# Patient Record
Sex: Female | Born: 1970 | Race: Black or African American | Hispanic: No | Marital: Single | Smoking: Former smoker
Health system: Southern US, Community
[De-identification: ages and names within clinical notes are randomized; demographics above are authoritative.]

## PROBLEM LIST (undated history)

## (undated) DIAGNOSIS — M1990 Unspecified osteoarthritis, unspecified site: Secondary | ICD-10-CM

## (undated) DIAGNOSIS — E785 Hyperlipidemia, unspecified: Secondary | ICD-10-CM

## (undated) DIAGNOSIS — I10 Essential (primary) hypertension: Secondary | ICD-10-CM

## (undated) DIAGNOSIS — G4700 Insomnia, unspecified: Secondary | ICD-10-CM

## (undated) DIAGNOSIS — B019 Varicella without complication: Secondary | ICD-10-CM

## (undated) DIAGNOSIS — K219 Gastro-esophageal reflux disease without esophagitis: Secondary | ICD-10-CM

## (undated) DIAGNOSIS — N390 Urinary tract infection, site not specified: Secondary | ICD-10-CM

## (undated) DIAGNOSIS — D649 Anemia, unspecified: Secondary | ICD-10-CM

## (undated) DIAGNOSIS — F419 Anxiety disorder, unspecified: Secondary | ICD-10-CM

## (undated) DIAGNOSIS — M545 Low back pain, unspecified: Secondary | ICD-10-CM

## (undated) DIAGNOSIS — T7840XA Allergy, unspecified, initial encounter: Secondary | ICD-10-CM

## (undated) DIAGNOSIS — D259 Leiomyoma of uterus, unspecified: Secondary | ICD-10-CM

## (undated) DIAGNOSIS — Z6981 Encounter for mental health services for victim of other abuse: Secondary | ICD-10-CM

## (undated) DIAGNOSIS — A599 Trichomoniasis, unspecified: Secondary | ICD-10-CM

## (undated) DIAGNOSIS — G5603 Carpal tunnel syndrome, bilateral upper limbs: Secondary | ICD-10-CM

## (undated) DIAGNOSIS — F32A Depression, unspecified: Secondary | ICD-10-CM

## (undated) DIAGNOSIS — R51 Headache: Secondary | ICD-10-CM

## (undated) DIAGNOSIS — F329 Major depressive disorder, single episode, unspecified: Secondary | ICD-10-CM

## (undated) DIAGNOSIS — R519 Headache, unspecified: Secondary | ICD-10-CM

## (undated) HISTORY — DX: Headache, unspecified: R51.9

## (undated) HISTORY — DX: Allergy, unspecified, initial encounter: T78.40XA

## (undated) HISTORY — DX: Encounter for mental health services for victim of other abuse: Z69.81

## (undated) HISTORY — DX: Gastro-esophageal reflux disease without esophagitis: K21.9

## (undated) HISTORY — DX: Unspecified osteoarthritis, unspecified site: M19.90

## (undated) HISTORY — DX: Hyperlipidemia, unspecified: E78.5

## (undated) HISTORY — DX: Essential (primary) hypertension: I10

## (undated) HISTORY — PX: TUBAL LIGATION: SHX77

## (undated) HISTORY — DX: Anxiety disorder, unspecified: F41.9

## (undated) HISTORY — DX: Trichomoniasis, unspecified: A59.9

## (undated) HISTORY — PX: ABDOMINAL HYSTERECTOMY: SHX81

## (undated) HISTORY — DX: Depression, unspecified: F32.A

## (undated) HISTORY — DX: Urinary tract infection, site not specified: N39.0

## (undated) HISTORY — DX: Varicella without complication: B01.9

## (undated) HISTORY — DX: Carpal tunnel syndrome, bilateral upper limbs: G56.03

## (undated) HISTORY — PX: ENDOMETRIAL BIOPSY: SHX622

## (undated) HISTORY — DX: Low back pain, unspecified: M54.50

## (undated) HISTORY — DX: Major depressive disorder, single episode, unspecified: F32.9

## (undated) HISTORY — DX: Low back pain: M54.5

## (undated) HISTORY — PX: CARPAL TUNNEL RELEASE: SHX101

## (undated) HISTORY — DX: Headache: R51

## (undated) HISTORY — DX: Insomnia, unspecified: G47.00

---

## 1999-09-10 HISTORY — PX: EYE SURGERY: SHX253

## 2006-01-09 ENCOUNTER — Encounter: Payer: Self-pay | Admitting: Family Medicine

## 2006-02-07 ENCOUNTER — Encounter: Payer: Self-pay | Admitting: Family Medicine

## 2006-03-09 ENCOUNTER — Encounter: Payer: Self-pay | Admitting: Family Medicine

## 2007-07-09 ENCOUNTER — Emergency Department: Payer: Self-pay | Admitting: Emergency Medicine

## 2010-05-02 ENCOUNTER — Emergency Department: Payer: Self-pay | Admitting: Emergency Medicine

## 2010-09-15 ENCOUNTER — Emergency Department: Payer: Self-pay | Admitting: Emergency Medicine

## 2010-09-25 ENCOUNTER — Emergency Department: Payer: Self-pay | Admitting: Emergency Medicine

## 2010-10-12 ENCOUNTER — Ambulatory Visit: Payer: Self-pay | Admitting: Physician Assistant

## 2013-08-13 ENCOUNTER — Emergency Department: Payer: Self-pay | Admitting: Emergency Medicine

## 2015-09-22 ENCOUNTER — Ambulatory Visit
Admission: EM | Admit: 2015-09-22 | Discharge: 2015-09-22 | Disposition: A | Discharge status: Home / Self Care | Payer: PRIVATE HEALTH INSURANCE | Attending: Family Medicine | Admitting: Family Medicine

## 2015-09-22 ENCOUNTER — Encounter: Payer: Self-pay | Admitting: Gynecology

## 2015-09-22 DIAGNOSIS — M722 Plantar fascial fibromatosis: Secondary | ICD-10-CM | POA: Diagnosis not present

## 2015-09-22 DIAGNOSIS — H6503 Acute serous otitis media, bilateral: Secondary | ICD-10-CM | POA: Diagnosis not present

## 2015-09-22 HISTORY — DX: Leiomyoma of uterus, unspecified: D25.9

## 2015-09-22 MED ORDER — GUAIFENESIN-CODEINE 100-10 MG/5ML PO SOLN: ORAL | Status: DC

## 2015-09-22 MED ORDER — AMOXICILLIN 875 MG PO TABS: 875.0000 mg | ORAL_TABLET | Freq: Two times a day (BID) | ORAL | Status: DC

## 2015-09-22 NOTE — Discharge Instructions (Signed)
Plantar Fasciitis With Rehab The plantar fascia is a fibrous, ligament-like, soft-tissue structure that spans the bottom of the foot. Plantar fasciitis, also called heel spur syndrome, is a condition that causes pain in the foot due to inflammation of the tissue. SYMPTOMS   Pain and tenderness on the underneath side of the foot.  Pain that worsens with standing or walking. CAUSES  Plantar fasciitis is caused by irritation and injury to the plantar fascia on the underneath side of the foot. Common mechanisms of injury include:  Direct trauma to bottom of the foot.  Damage to a small nerve that runs under the foot where the main fascia attaches to the heel bone.  Stress placed on the plantar fascia due to bone spurs. RISK INCREASES WITH:   Activities that place stress on the plantar fascia (running, jumping, pivoting, or cutting).  Poor strength and flexibility.  Improperly fitted shoes.  Tight calf muscles.  Flat feet.  Failure to warm-up properly before activity.  Obesity. PREVENTION  Warm up and stretch properly before activity.  Allow for adequate recovery between workouts.  Maintain physical fitness:  Strength, flexibility, and endurance.  Cardiovascular fitness.  Maintain a health body weight.  Avoid stress on the plantar fascia.  Wear properly fitted shoes, including arch supports for individuals who have flat feet. PROGNOSIS  If treated properly, then the symptoms of plantar fasciitis usually resolve without surgery. However, occasionally surgery is necessary. RELATED COMPLICATIONS   Recurrent symptoms that may result in a chronic condition.  Problems of the lower back that are caused by compensating for the injury, such as limping.  Pain or weakness of the foot during push-off following surgery.  Chronic inflammation, scarring, and partial or complete fascia tear, occurring more often from repeated injections. TREATMENT  Treatment initially involves  the use of ice and medication to help reduce pain and inflammation. The use of strengthening and stretching exercises may help reduce pain with activity, especially stretches of the Achilles tendon. These exercises may be performed at home or with a therapist. Your caregiver may recommend that you use heel cups of arch supports to help reduce stress on the plantar fascia. Occasionally, corticosteroid injections are given to reduce inflammation. If symptoms persist for greater than 6 months despite non-surgical (conservative), then surgery may be recommended.  MEDICATION   If pain medication is necessary, then nonsteroidal anti-inflammatory medications, such as aspirin and ibuprofen, or other minor pain relievers, such as acetaminophen, are often recommended.  Do not take pain medication within 7 days before surgery.  Prescription pain relievers may be given if deemed necessary by your caregiver. Use only as directed and only as much as you need.  Corticosteroid injections may be given by your caregiver. These injections should be reserved for the most serious cases, because they may only be given a certain number of times. HEAT AND COLD  Cold treatment (icing) relieves pain and reduces inflammation. Cold treatment should be applied for 10 to 15 minutes every 2 to 3 hours for inflammation and pain and immediately after any activity that aggravates your symptoms. Use ice packs or massage the area with a piece of ice (ice massage).  Heat treatment may be used prior to performing the stretching and strengthening activities prescribed by your caregiver, physical therapist, or athletic trainer. Use a heat pack or soak the injury in warm water. SEEK IMMEDIATE MEDICAL CARE IF:  Treatment seems to offer no benefit, or the condition worsens.  Any medications produce adverse side effects.   EXERCISES RANGE OF MOTION (ROM) AND STRETCHING EXERCISES - Plantar Fasciitis (Heel Spur Syndrome) These exercises may  help you when beginning to rehabilitate your injury. Your symptoms may resolve with or without further involvement from your physician, physical therapist or athletic trainer. While completing these exercises, remember:   Restoring tissue flexibility helps normal motion to return to the joints. This allows healthier, less painful movement and activity.  An effective stretch should be held for at least 30 seconds.  A stretch should never be painful. You should only feel a gentle lengthening or release in the stretched tissue. RANGE OF MOTION - Toe Extension, Flexion  Sit with your right / left leg crossed over your opposite knee.  Grasp your toes and gently pull them back toward the top of your foot. You should feel a stretch on the bottom of your toes and/or foot.  Hold this stretch for __________ seconds.  Now, gently pull your toes toward the bottom of your foot. You should feel a stretch on the top of your toes and or foot.  Hold this stretch for __________ seconds. Repeat __________ times. Complete this stretch __________ times per day.  RANGE OF MOTION - Ankle Dorsiflexion, Active Assisted  Remove shoes and sit on a chair that is preferably not on a carpeted surface.  Place right / left foot under knee. Extend your opposite leg for support.  Keeping your heel down, slide your right / left foot back toward the chair until you feel a stretch at your ankle or calf. If you do not feel a stretch, slide your bottom forward to the edge of the chair, while still keeping your heel down.  Hold this stretch for __________ seconds. Repeat __________ times. Complete this stretch __________ times per day.  STRETCH - Gastroc, Standing  Place hands on wall.  Extend right / left leg, keeping the front knee somewhat bent.  Slightly point your toes inward on your back foot.  Keeping your right / left heel on the floor and your knee straight, shift your weight toward the wall, not allowing your  back to arch.  You should feel a gentle stretch in the right / left calf. Hold this position for __________ seconds. Repeat __________ times. Complete this stretch __________ times per day. STRETCH - Soleus, Standing  Place hands on wall.  Extend right / left leg, keeping the other knee somewhat bent.  Slightly point your toes inward on your back foot.  Keep your right / left heel on the floor, bend your back knee, and slightly shift your weight over the back leg so that you feel a gentle stretch deep in your back calf.  Hold this position for __________ seconds. Repeat __________ times. Complete this stretch __________ times per day. STRETCH - Gastrocsoleus, Standing  Note: This exercise can place a lot of stress on your foot and ankle. Please complete this exercise only if specifically instructed by your caregiver.   Place the ball of your right / left foot on a step, keeping your other foot firmly on the same step.  Hold on to the wall or a rail for balance.  Slowly lift your other foot, allowing your body weight to press your heel down over the edge of the step.  You should feel a stretch in your right / left calf.  Hold this position for __________ seconds.  Repeat this exercise with a slight bend in your right / left knee. Repeat __________ times. Complete this stretch __________ times   per day.  STRENGTHENING EXERCISES - Plantar Fasciitis (Heel Spur Syndrome)  These exercises may help you when beginning to rehabilitate your injury. They may resolve your symptoms with or without further involvement from your physician, physical therapist or athletic trainer. While completing these exercises, remember:   Muscles can gain both the endurance and the strength needed for everyday activities through controlled exercises.  Complete these exercises as instructed by your physician, physical therapist or athletic trainer. Progress the resistance and repetitions only as  guided. STRENGTH - Towel Curls  Sit in a chair positioned on a non-carpeted surface.  Place your foot on a towel, keeping your heel on the floor.  Pull the towel toward your heel by only curling your toes. Keep your heel on the floor.  If instructed by your physician, physical therapist or athletic trainer, add ____________________ at the end of the towel. Repeat __________ times. Complete this exercise __________ times per day. STRENGTH - Ankle Inversion  Secure one end of a rubber exercise band/tubing to a fixed object (table, pole). Loop the other end around your foot just before your toes.  Place your fists between your knees. This will focus your strengthening at your ankle.  Slowly, pull your big toe up and in, making sure the band/tubing is positioned to resist the entire motion.  Hold this position for __________ seconds.  Have your muscles resist the band/tubing as it slowly pulls your foot back to the starting position. Repeat __________ times. Complete this exercises __________ times per day.    This information is not intended to replace advice given to you by your health care provider. Make sure you discuss any questions you have with your health care provider.   Document Released: 08/26/2005 Document Revised: 01/10/2015 Document Reviewed: 12/08/2008 Elsevier Interactive Patient Education 2016 [employer]vier Inc[employer] Plantar Fasciitis Plantar fasciitis is a painful foot condition that affects the heel. It occurs when the band of tissue that connects the toes to the heel bone (plantar fascia) becomes irritated. This can happen after exercising too much or doing other repetitive activities (overuse injury). The pain from plantar fasciitis can range from mild irritation to severe pain that makes it difficult for you to walk or move. The pain is usually worse in the morning or after you have been sitting or lying down for a while. CAUSES This condition may be caused by:  Standing for  long periods of time.  Wearing shoes that do not fit.  Doing high-impact activities, including running, aerobics, and ballet.  Being overweight.  Having an abnormal way of walking (gait).  Having tight calf muscles.  Having high arches in your feet.  Starting a new athletic activity. SYMPTOMS The main symptom of this condition is heel pain. Other symptoms include:  Pain that gets worse after activity or exercise.  Pain that is worse in the morning or after resting.  Pain that goes away after you walk for a few minutes. DIAGNOSIS This condition may be diagnosed based on your signs and symptoms. Your health care provider will also do a physical exam to check for:  A tender area on the bottom of your foot.  A high arch in your foot.  Pain when you move your foot.  Difficulty moving your foot. You may also need to have imaging studies to confirm the diagnosis. These can include:  X-rays.  Ultrasound.  MRI. TREATMENT  Treatment for plantar fasciitis depends on the severity of the condition. Your treatment may include:  Rest, ice,   and over-the-counter pain medicines to manage your pain.  Exercises to stretch your calves and your plantar fascia.  A splint that holds your foot in a stretched, upward position while you sleep (night splint).  Physical therapy to relieve symptoms and prevent problems in the future.  Cortisone injections to relieve severe pain.  Extracorporeal shock wave therapy (ESWT) to stimulate damaged plantar fascia with electrical impulses. It is often used as a last resort before surgery.  Surgery, if other treatments have not worked after 12 months. HOME CARE INSTRUCTIONS  Take medicines only as directed by your health care provider.  Avoid activities that cause pain.  Roll the bottom of your foot over a bag of ice or a bottle of cold water. Do this for 20 minutes, 3-4 times a day.  Perform simple stretches as directed by your health care  provider.  Try wearing athletic shoes with air-sole or gel-sole cushions or soft shoe inserts.  Wear a night splint while sleeping, if directed by your health care provider.  Keep all follow-up appointments with your health care provider. PREVENTION   Do not perform exercises or activities that cause heel pain.  Consider finding low-impact activities if you continue to have problems.  Lose weight if you need to. The best way to prevent plantar fasciitis is to avoid the activities that aggravate your plantar fascia. SEEK MEDICAL CARE IF:  Your symptoms do not go away after treatment with home care measures.  Your pain gets worse.  Your pain affects your ability to move or do your daily activities.   This information is not intended to replace advice given to you by your health care provider. Make sure you discuss any questions you have with your health care provider.   Document Released: 05/21/2001 Document Revised: 05/17/2015 Document Reviewed: 07/06/2014 Elsevier Interactive Patient Education 2016 Elsevier Inc.  

## 2015-09-22 NOTE — ED Provider Notes (Signed)
CSN: [id number]     Arrival date & time 09/22/15  1709 History   First MD Initiated Contact with Patient 09/22/15 1837     Chief Complaint  Patient presents with  . Foot Pain  . Cough   (Consider location/radiation/quality/duration/timing/severity/associated sxs/prior Treatment) HPI Comments: 45 yo female with a  3 months h/o bilateral heel pain. Denies any injuries, trauma, falls, fevers, chills, redness, swelling,  recent exercises or new shoes.  Also complains of one week of cough, nasal congestion, ear pressure.   The history is provided by the patient.    Past Medical History  Diagnosis Date  . Uterine fibroid    Past Surgical History  Procedure Laterality Date  . Tubal ligation     No family history on file. Social History  Substance Use Topics  . Smoking status: Never Smoker   . Smokeless tobacco: None  . Alcohol Use: Yes   OB History    No data available     Review of Systems  Allergies  Review of patient's allergies indicates no known allergies.  Home Medications   Prior to Admission medications   Medication Sig Start Date End Date Taking? Authorizing Provider  amoxicillin (AMOXIL) 875 MG tablet Take 1 tablet (875 mg total) by mouth 2 (two) times daily. 09/22/15   Norval Gable, MD  guaiFENesin-codeine 100-10 MG/5ML syrup 1-2 teaspoons po bid prn 09/22/15   Norval Gable, MD   Meds Ordered and Administered this Visit  Medications - No data to display  BP 140/65 mmHg  Pulse 7  Temp(Src) 98.1 F (36.7 C) (Oral)  Resp 18  Ht 5\' 4"  (1.626 m)  Wt 210 lb (95.255 kg)  BMI 36.03 kg/m2  SpO2 100%  LMP 09/11/2015 No data found.   Physical Exam  Constitutional: She appears well-developed and well-nourished. No distress.  HENT:  Head: Normocephalic and atraumatic.  Right Ear: External ear and ear canal normal. Tympanic membrane is erythematous and bulging. A middle ear effusion is present.  Left Ear: External ear and ear canal normal. Tympanic membrane is  erythematous and bulging. A middle ear effusion is present.  Nose: No mucosal edema, rhinorrhea, nose lacerations, sinus tenderness, nasal deformity, septal deviation or nasal septal hematoma. No epistaxis.  No foreign bodies.  Mouth/Throat: Uvula is midline, oropharynx is clear and moist and mucous membranes are normal. No oropharyngeal exudate.  Eyes: Conjunctivae and EOM are normal. Pupils are equal, round, and reactive to light. Right eye exhibits no discharge. Left eye exhibits no discharge. No scleral icterus.  Neck: Normal range of motion. Neck supple. No thyromegaly present.  Cardiovascular: Normal rate, regular rhythm and normal heart sounds.   Pulmonary/Chest: Effort normal and breath sounds normal. No respiratory distress. She has no wheezes. She has no rales.  Musculoskeletal:       Right foot: There is tenderness (to palpation over the heel and plantar fascia).       Left foot: There is tenderness (to palpation over the heel and plantar fascia).  Lymphadenopathy:    She has no cervical adenopathy.  Skin: She is not diaphoretic.  Nursing note and vitals reviewed.   ED Course  Procedures (including critical care time)  Labs Review Labs Reviewed - No data to display  Imaging Review No results found.   Visual Acuity Review  Right Eye Distance:   Left Eye Distance:   Bilateral Distance:    Right Eye Near:   Left Eye Near:    Bilateral Near:  MDM   1. Bilateral acute serous otitis media, recurrence not specified   2. Plantar fasciitis, bilateral    Discharge Medication List as of 09/22/2015  6:53 PM    START taking these medications   Details  amoxicillin (AMOXIL) 875 MG tablet Take 1 tablet (875 mg total) by mouth 2 (two) times daily., Starting 09/22/2015, Until Discontinued, Normal    guaiFENesin-codeine 100-10 MG/5ML syrup 1-2 teaspoons po bid prn, Print       1. diagnosis reviewed with patient 2. rx as per orders above; reviewed possible side  effects, interactions, risks and benefits  3. Recommend supportive treatment with plantar fascia stretches, ice, otc analgesics prn 4. Follow-up prn if symptoms worsen or don't improve    Norval Gable, MD 09/22/15 2217

## 2015-09-22 NOTE — ED Notes (Signed)
Patient c/o bilateral heel pain x 3 months. Per patient painful to walk. Per patient no injury to bilateral foot or heel. Pt. Also stated dry cough x 1.5 weeks.

## 2016-10-07 ENCOUNTER — Encounter: Payer: Self-pay | Admitting: Family Medicine

## 2016-10-07 ENCOUNTER — Ambulatory Visit (INDEPENDENT_AMBULATORY_CARE_PROVIDER_SITE_OTHER): Payer: PRIVATE HEALTH INSURANCE | Admitting: Family Medicine

## 2016-10-07 VITALS — BP 142/82 | HR 76 | Temp 98.0°F | Resp 16 | Ht 64.0 in | Wt 224.0 lb

## 2016-10-07 DIAGNOSIS — R5381 Other malaise: Secondary | ICD-10-CM

## 2016-10-07 DIAGNOSIS — R5383 Other fatigue: Secondary | ICD-10-CM

## 2016-10-07 DIAGNOSIS — Z86018 Personal history of other benign neoplasm: Secondary | ICD-10-CM

## 2016-10-07 NOTE — Progress Notes (Signed)
Patient: Stacey Roberson Female    DOB: May 05, 1971   46 y.o.   MRN: [id number] Visit Date: 10/07/2016  Today's Provider: Vernie Murders, PA   Chief Complaint  Patient presents with  . Establish Care   Subjective:    HPI Patient here to establish care with Vernie Murders, PA patient does see him at [facility]. Patient reports she is a patient at [facility] in [place], [place]. Patient reports she is taking a trial medication for fibroids.     Patient c/o elevated blood pressure at work. Patient denies chest pain, shortness of breath. Patient reports swelling on left ankle. Patient does not participate in structured exercise patient reports she does follow a low salt diet.   Patient c/o URI symptoms x's several weeks on and off since Christmas. Patient reports she has tried OTC medications, Sudafed, Tylenol, NyQuil, Ibuprofen and Excedrin patient reports mild symptom control.   Past Medical History:  Diagnosis Date  . Uterine fibroid    Past Surgical History:  Procedure Laterality Date  . EYE SURGERY Bilateral 2001  . TUBAL LIGATION     Family History  Problem Relation Age of Onset  . Healthy Mother   . Arthritis Father   . Healthy Brother   . Stroke Brother 30    Allergies  Allergen Reactions  . Bee Venom Swelling    Current Outpatient Prescriptions:  .  albuterol (PROVENTIL HFA;VENTOLIN HFA) 108 (90 Base) MCG/ACT inhaler, Inhale into the lungs., Disp: , Rfl:  .  fluticasone (FLONASE) 50 MCG/ACT nasal spray, 2 sprays by Each Nare route daily., Disp: , Rfl:   Review of Systems  Constitutional: Positive for fatigue.  HENT: Positive for congestion, dental problem, ear pain, sinus pain, sneezing, sore throat and tinnitus.   Eyes: Positive for photophobia.  Respiratory: Positive for cough.   Gastrointestinal: Positive for constipation.  Genitourinary: Positive for frequency.  Musculoskeletal: Positive for arthralgias, back pain, myalgias, neck pain and  neck stiffness.  Allergic/Immunologic: Positive for environmental allergies.  Neurological: Positive for dizziness, light-headedness and headaches.  Psychiatric/Behavioral: Positive for agitation and sleep disturbance.    Social History  Substance Use Topics  . Smoking status: Light Tobacco Smoker    Types: Cigarettes  . Smokeless tobacco: Never Used  . Alcohol use Yes   Objective:   BP (!) 142/82 (BP Location: Left Arm, Patient Position: Sitting, Cuff Size: Large)   Pulse 76   Temp 98 F (36.7 C) (Oral)   Resp 16   Ht 5\' 4"  (1.626 m)   Wt 224 lb (101.6 kg)   SpO2 99%   BMI 38.45 kg/m   Physical Exam  Constitutional: She is oriented to person, place, and time. She appears well-developed and well-nourished.  HENT:  Head: Normocephalic.  Right Ear: External ear normal.  Left Ear: External ear normal.  Mouth/Throat: Oropharynx is clear and moist.  Pale membranes.  Eyes: EOM are normal.  Pale conjunctivae.  Neck: Neck supple.  Cardiovascular: Normal rate and regular rhythm.   Pulmonary/Chest: Effort normal and breath sounds normal.  Abdominal: Soft. Bowel sounds are normal.  Musculoskeletal: Normal range of motion.  Neurological: She is alert and oriented to person, place, and time.  Psychiatric: She has a normal mood and affect. Her behavior is normal.      Assessment & Plan:     1. Malaise and fatigue Chronic occurrence without history of melena, hematochezia, hematemesis or hematuria. Has a craving for "starch". LMP 1 month  ago and has a history of menses 2 times a month. Presently on a drug trial (she can't remember the name of the drug and will bring in the bottle for documentation). Will check labs for possible anemia versus side effect of a new medication started April 2017. Last colonoscopy in 2012 was normal. Suspect URI secondary to anemia. - Ferritin - CBC with Differential/Platelet - Comprehensive metabolic panel - TSH  2. Hx of uterine leiomyoma Followed  by GYN on a drug study for fibroids. May need to recheck with GYN and consider surgical removal of fibroids or hysterectomy. Check labs for signs of anemia. - Ferritin - CBC with Differential/Platelet        Vernie Murders, PA  [facility]

## 2016-10-07 NOTE — Patient Instructions (Signed)
Anemia, Nonspecific Anemia is a condition in which the concentration of red blood cells or hemoglobin in the blood is below normal. Hemoglobin is a substance in red blood cells that carries oxygen to the tissues of the body. Anemia results in not enough oxygen reaching these tissues. What are the causes? Common causes of anemia include:  Excessive bleeding. Bleeding may be internal or external. This includes excessive bleeding from periods (in women) or from the intestine.  Poor nutrition.  Chronic kidney, thyroid, and liver disease.  Bone marrow disorders that decrease red blood cell production.  Cancer and treatments for cancer.  HIV, AIDS, and their treatments.  Spleen problems that increase red blood cell destruction.  Blood disorders.  Excess destruction of red blood cells due to infection, medicines, and autoimmune disorders. What are the signs or symptoms?  Minor weakness.  Dizziness.  Headache.  Palpitations.  Shortness of breath, especially with exercise.  Paleness.  Cold sensitivity.  Indigestion.  Nausea.  Difficulty sleeping.  Difficulty concentrating. Symptoms may occur suddenly or they may develop slowly. How is this diagnosed? Additional blood tests are often needed. These help your health care provider determine the best treatment. Your health care provider will check your stool for blood and look for other causes of blood loss. How is this treated? Treatment varies depending on the cause of the anemia. Treatment can include:  Supplements of iron, vitamin B12, or folic acid.  Hormone medicines.  A blood transfusion. This may be needed if blood loss is severe.  Hospitalization. This may be needed if there is significant continual blood loss.  Dietary changes.  Spleen removal. Follow these instructions at home: Keep all follow-up appointments. It often takes many weeks to correct anemia, and having your health care provider check on your  condition and your response to treatment is very important. Get help right away if:  You develop extreme weakness, shortness of breath, or chest pain.  You become dizzy or have trouble concentrating.  You develop heavy vaginal bleeding.  You develop a rash.  You have bloody or black, tarry stools.  You faint.  You vomit up blood.  You vomit repeatedly.  You have abdominal pain.  You have a fever or persistent symptoms for more than 2-3 days.  You have a fever and your symptoms suddenly get worse.  You are dehydrated. This information is not intended to replace advice given to you by your health care provider. Make sure you discuss any questions you have with your health care provider. Document Released: 10/03/2004 Document Revised: 02/07/2016 Document Reviewed: 02/19/2013 Elsevier Interactive Patient Education  2017 Elsevier Inc.  

## 2016-10-08 LAB — FERRITIN: Ferritin: 26 ng/mL (ref 15–150)

## 2016-10-08 LAB — COMPREHENSIVE METABOLIC PANEL
ALT: 9 IU/L (ref 0–32)
AST: 12 IU/L (ref 0–40)
Albumin/Globulin Ratio: 1.6 (ref 1.2–2.2)
Albumin: 4.5 g/dL (ref 3.5–5.5)
Alkaline Phosphatase: 68 IU/L (ref 39–117)
BUN/Creatinine Ratio: 10 (ref 9–23)
BUN: 9 mg/dL (ref 6–24)
Bilirubin Total: <0.2 mg/dL (ref 0.0–1.2)
CO2: 27 mmol/L (ref 18–29)
Calcium: 9.9 mg/dL (ref 8.7–10.2)
Chloride: 101 mmol/L (ref 96–106)
Creatinine, Ser: 0.86 mg/dL (ref 0.57–1.00)
GFR calc Af Amer: 93 mL/min/{1.73_m2} (ref >59)
GFR calc non Af Amer: 81 mL/min/{1.73_m2} (ref >59)
Globulin, Total: 2.9 g/dL (ref 1.5–4.5)
Glucose: 108 mg/dL — ABNORMAL HIGH (ref 65–99)
Potassium: 4.5 mmol/L (ref 3.5–5.2)
Sodium: 141 mmol/L (ref 134–144)
Total Protein: 7.4 g/dL (ref 6.0–8.5)

## 2016-10-08 LAB — CBC WITH DIFFERENTIAL/PLATELET
Basophils Absolute: 0 10*3/uL (ref 0.0–0.2)
Basos: 1 %
EOS (ABSOLUTE): 0.1 10*3/uL (ref 0.0–0.4)
Eos: 1 %
Hematocrit: 38.2 % (ref 34.0–46.6)
Hemoglobin: 11.8 g/dL (ref 11.1–15.9)
Immature Grans (Abs): 0 10*3/uL (ref 0.0–0.1)
Immature Granulocytes: 0 %
Lymphocytes Absolute: 1.9 10*3/uL (ref 0.7–3.1)
Lymphs: 30 %
MCH: 27.7 pg (ref 26.6–33.0)
MCHC: 30.9 g/dL — ABNORMAL LOW (ref 31.5–35.7)
MCV: 90 fL (ref 79–97)
Monocytes Absolute: 0.4 10*3/uL (ref 0.1–0.9)
Monocytes: 6 %
Neutrophils Absolute: 3.8 10*3/uL (ref 1.4–7.0)
Neutrophils: 62 %
Platelets: 289 10*3/uL (ref 150–379)
RBC: 4.26 x10E6/uL (ref 3.77–5.28)
RDW: 14.7 % (ref 12.3–15.4)
WBC: 6.2 10*3/uL (ref 3.4–10.8)

## 2016-10-08 LAB — TSH: TSH: 1.07 u[IU]/mL (ref 0.450–4.500)

## 2016-10-08 NOTE — Progress Notes (Signed)
Advised  ED 

## 2016-11-06 ENCOUNTER — Encounter: Payer: Self-pay | Admitting: Family Medicine

## 2017-04-15 ENCOUNTER — Ambulatory Visit (INDEPENDENT_AMBULATORY_CARE_PROVIDER_SITE_OTHER): Payer: PRIVATE HEALTH INSURANCE | Admitting: Podiatry

## 2017-04-15 ENCOUNTER — Ambulatory Visit (INDEPENDENT_AMBULATORY_CARE_PROVIDER_SITE_OTHER): Payer: PRIVATE HEALTH INSURANCE

## 2017-04-15 DIAGNOSIS — M779 Enthesopathy, unspecified: Secondary | ICD-10-CM

## 2017-04-15 DIAGNOSIS — M7661 Achilles tendinitis, right leg: Secondary | ICD-10-CM | POA: Diagnosis not present

## 2017-04-15 DIAGNOSIS — M7752 Other enthesopathy of left foot: Secondary | ICD-10-CM

## 2017-04-15 DIAGNOSIS — M7662 Achilles tendinitis, left leg: Secondary | ICD-10-CM | POA: Diagnosis not present

## 2017-04-15 DIAGNOSIS — M7751 Other enthesopathy of right foot: Secondary | ICD-10-CM

## 2017-04-15 MED ORDER — METHYLPREDNISOLONE 4 MG PO TBPK: ORAL_TABLET | ORAL | 0 refills | Status: DC

## 2017-04-15 MED ORDER — DICLOFENAC SODIUM 75 MG PO TBEC: 75.0000 mg | DELAYED_RELEASE_TABLET | Freq: Two times a day (BID) | ORAL | 0 refills | Status: DC

## 2017-04-15 NOTE — Progress Notes (Signed)
   Subjective:    Patient ID: Stacey Roberson, female    DOB: 21-Aug-1971, 46 y.o.   MRN: [id number]  HPI    Review of Systems  Constitutional: Positive for fatigue.  Eyes: Negative.   Respiratory: Negative.   Cardiovascular: Positive for leg swelling.       Calf pain when walking   Endocrine: Negative.   Genitourinary: Negative.        Constipation  Musculoskeletal: Positive for back pain and myalgias.  Skin: Negative.   Allergic/Immunologic: Negative.   Neurological: Positive for dizziness, light-headedness, numbness and headaches.  Hematological: Negative.   Psychiatric/Behavioral: Negative.   All other systems reviewed and are negative.      Objective:   Physical Exam        Assessment & Plan:

## 2017-04-19 MED ORDER — BETAMETHASONE SOD PHOS & ACET 6 (3-3) MG/ML IJ SUSP: 3.0000 mg | Freq: Once | INTRAMUSCULAR | Status: DC

## 2017-04-19 NOTE — Progress Notes (Signed)
Patient ID: Stacey Roberson, female   DOB: Feb 14, 1971, 46 y.o.   MRN: [id number]   HPI: 46 year old female presents to the office today for evaluation of pain to the back of the heels this been going on for proximal he 6 months now. Patient denies trauma with a gradual onset. Patient states that now hurts to walk extensive periods in the back of her heels are constantly painful.   Physical Exam: General: The patient is alert and oriented x3 in no acute distress.  Dermatology: Skin is warm, dry and supple bilateral lower extremities. Negative for open lesions or macerations.  Vascular: Palpable pedal pulses bilaterally. No edema or erythema noted. Capillary refill within normal limits.  Neurological: Epicritic and protective threshold grossly intact bilaterally.   Musculoskeletal Exam: There is some pain on palpation noted to the posterior tubercle of the bilateral heels at the insertion of the Achilles tendon with the left more painful than the right. Pain with forced dorsiflexion of the ankle joint also noted.  Radiographic Exam:  Normal osseous mineralization. Joint spaces preserved. No fracture/dislocation/boney destruction.  There is some posterior heel spurring noted to the bilateral calcaneus best visualized on lateral view  Assessment: 1. 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 Sub-Achilles bursitis bilateral   Plan of Care:  1. Patient was evaluated. X-rays reviewed today 2. Injection of 0.5 mL Celestone Soluspan injected in bilateral bursa sub-Achilles. Care was taken to avoid injection into the Achilles tendon 3. Immobilization cam boot dispensed for the left lower extremity 4. Ankle brace dispensed for the right lower extremity 5. Prescription for diclofenac 75 mg 6. Prescription for Medrol Dosepak 7. Return to clinic in 4 weeks   Edrick Kins, DPM [facility]  Dr. Edrick Kins, DPM    [address], [place] [address]                Office [phone]  Fax [phone]

## 2017-05-13 ENCOUNTER — Ambulatory Visit: Payer: PRIVATE HEALTH INSURANCE | Admitting: Podiatry

## 2017-06-20 DIAGNOSIS — R7303 Prediabetes: Secondary | ICD-10-CM | POA: Insufficient documentation

## 2017-06-20 DIAGNOSIS — R7309 Other abnormal glucose: Secondary | ICD-10-CM | POA: Insufficient documentation

## 2017-06-20 DIAGNOSIS — E785 Hyperlipidemia, unspecified: Secondary | ICD-10-CM | POA: Insufficient documentation

## 2017-12-11 ENCOUNTER — Ambulatory Visit (INDEPENDENT_AMBULATORY_CARE_PROVIDER_SITE_OTHER): Payer: BLUE CROSS/BLUE SHIELD | Admitting: Internal Medicine

## 2017-12-11 ENCOUNTER — Encounter: Payer: Self-pay | Admitting: Internal Medicine

## 2017-12-11 ENCOUNTER — Other Ambulatory Visit (HOSPITAL_COMMUNITY)
Admission: RE | Admit: 2017-12-11 | Discharge: 2017-12-11 | Disposition: A | Discharge status: Home / Self Care | Payer: BLUE CROSS/BLUE SHIELD | Source: Ambulatory Visit | Attending: Internal Medicine | Admitting: Internal Medicine

## 2017-12-11 VITALS — BP 140/70 | HR 78 | Temp 98.2°F | Ht 64.0 in | Wt 225.8 lb

## 2017-12-11 DIAGNOSIS — Z113 Encounter for screening for infections with a predominantly sexual mode of transmission: Secondary | ICD-10-CM

## 2017-12-11 DIAGNOSIS — K219 Gastro-esophageal reflux disease without esophagitis: Secondary | ICD-10-CM

## 2017-12-11 DIAGNOSIS — R7303 Prediabetes: Secondary | ICD-10-CM | POA: Diagnosis not present

## 2017-12-11 DIAGNOSIS — E782 Mixed hyperlipidemia: Secondary | ICD-10-CM

## 2017-12-11 DIAGNOSIS — Z23 Encounter for immunization: Secondary | ICD-10-CM

## 2017-12-11 DIAGNOSIS — N938 Other specified abnormal uterine and vaginal bleeding: Secondary | ICD-10-CM | POA: Diagnosis not present

## 2017-12-11 DIAGNOSIS — T7491XA Unspecified adult maltreatment, confirmed, initial encounter: Secondary | ICD-10-CM | POA: Insufficient documentation

## 2017-12-11 DIAGNOSIS — G4700 Insomnia, unspecified: Secondary | ICD-10-CM | POA: Diagnosis not present

## 2017-12-11 DIAGNOSIS — Z91030 Bee allergy status: Secondary | ICD-10-CM

## 2017-12-11 DIAGNOSIS — F419 Anxiety disorder, unspecified: Secondary | ICD-10-CM | POA: Diagnosis not present

## 2017-12-11 DIAGNOSIS — S90822A Blister (nonthermal), left foot, initial encounter: Secondary | ICD-10-CM

## 2017-12-11 DIAGNOSIS — F329 Major depressive disorder, single episode, unspecified: Secondary | ICD-10-CM | POA: Diagnosis not present

## 2017-12-11 DIAGNOSIS — D251 Intramural leiomyoma of uterus: Secondary | ICD-10-CM

## 2017-12-11 DIAGNOSIS — I10 Essential (primary) hypertension: Secondary | ICD-10-CM | POA: Diagnosis not present

## 2017-12-11 DIAGNOSIS — D259 Leiomyoma of uterus, unspecified: Secondary | ICD-10-CM | POA: Insufficient documentation

## 2017-12-11 DIAGNOSIS — J302 Other seasonal allergic rhinitis: Secondary | ICD-10-CM | POA: Diagnosis not present

## 2017-12-11 DIAGNOSIS — T7491XD Unspecified adult maltreatment, confirmed, subsequent encounter: Secondary | ICD-10-CM

## 2017-12-11 DIAGNOSIS — F32A Depression, unspecified: Secondary | ICD-10-CM

## 2017-12-11 MED ORDER — OMEPRAZOLE 20 MG PO CPDR: 20.0000 mg | DELAYED_RELEASE_CAPSULE | Freq: Every day | ORAL | 5 refills | Status: DC

## 2017-12-11 MED ORDER — SERTRALINE HCL 100 MG PO TABS: 150.0000 mg | ORAL_TABLET | Freq: Every day | ORAL | 11 refills | Status: DC

## 2017-12-11 MED ORDER — EPINEPHRINE 0.3 MG/0.3ML IJ SOAJ: 0.3000 mg | Freq: Once | INTRAMUSCULAR | 0 refills | Status: AC

## 2017-12-11 NOTE — Patient Instructions (Addendum)
[facility] [place] [address] [phone] [place]    Blisters, Adult A blister is a raised bubble of skin filled with liquid. Blisters often develop in an area of the skin that repeatedly rubs or presses against another surface (friction blister). Friction blisters can occur on any part of the body, but they usually develop on the hands or feet. Long-term pressure on the same area of the skin can also lead to areas of hardened skin (calluses). What are the causes? A blister can be caused by:  An injury.  A burn.  An allergic reaction.  An infection.  Exposure to irritating chemicals.  Friction, especially in an area with a lot of heat and moisture.  Friction blisters often result from:  Sports.  Repetitive activities.  Using tools and doing other activities without wearing gloves.  Shoes that are too tight or too loose.  What are the signs or symptoms? A blister is often round and looks like a bump. It may:  Itch.  Be painful to the touch.  Before a blister forms, the skin may:  Become red.  Feel warm.  Itch.  Be painful to the touch.  How is this diagnosed? A blister is diagnosed with a physical exam. How is this treated? Treatment usually involves protecting the area where the blister has formed until the skin has healed. Other treatments may include:  A bandage (dressing) to cover the blister.  Extra padding around and over the blister, so that it does not rub on anything.  Antibiotic ointment.  Most blisters break open, dry up, and go away on their own within 1-2 weeks. Blisters that are very painful may be drained before they break open on their own. If the blister is large or painful, it can be drained by: 1. Sterilizing a small needle with rubbing alcohol. 2. Washing your hands with soap and water. 3. Inserting the needle in the edge of the blister  to make a small hole. Some fluid will drain out of the hole. Let the top or roof of the blister stay in place. This helps the skin heal. 4. Washing the blister with mild soap and water. 5. Covering the blister with antibiotic ointment, if prescribed by your health care provider, and a dressing.  Some blisters may need to be drained by a health care provider. Follow these instructions at home:  Protect the area where the blister has formed as told by your health care provider.  Keep your blister clean and dry. This helps to prevent infection.  Do not pop your blister. This can cause infection.  If you were prescribed an antibiotic, use it as told by your health care provider. Do not stop using the antibiotic even if your condition improves.  Wear different shoes until the blister heals.  Avoid the activity that caused the blister until your blister heals.  Check your blister every day for signs of infection. Check for: ? More redness, swelling, or pain. ? More fluid or blood. ? Warmth. ? Pus or a bad smell. ? The blister getting better and then getting worse. How is this prevented? Taking these steps can help to prevent blisters that are caused by friction. Make sure you:  Wear comfortable shoes that fit well.  Always wear socks with shoes.  Wear extra socks or use tape, bandages, or pads over blister-prone areas as needed. You may also apply  petroleum jelly under bandages in blister-prone areas.  Wear protective gear, such as gloves, when participating in sports or activities that can cause blisters.  Wear loose-fitting, moisture-wicking clothes when participating in sports or activities.  Use powders as needed to keep your feet dry.  Contact a health care provider if:  You have more redness, swelling, or pain around your blister.  You have more fluid or blood coming from your blister.  Your blister feels warm to the touch.  You have pus or a bad smell coming from your  blister.  You have a fever or chills.  Your blister gets better and then it gets worse. This information is not intended to replace advice given to you by your health care provider. Make sure you discuss any questions you have with your health care provider. Document Released: 10/03/2004 Document Revised: 04/24/2016 Document Reviewed: 03/08/2016 Elsevier Interactive Patient Education  2018 [employer].   Cholesterol Cholesterol is a white, waxy, fat-like substance that is needed by the human body in small amounts. The liver makes all the cholesterol we need. Cholesterol is carried from the liver by the blood through the blood vessels. Deposits of cholesterol (plaques) may build up on blood vessel (artery) walls. Plaques make the arteries narrower and stiffer. Cholesterol plaques increase the risk for heart attack and stroke. You cannot feel your cholesterol level even if it is very high. The only way to know that it is high is to have a blood test. Once you know your cholesterol levels, you should keep a record of the test results. Work with your health care provider to keep your levels in the desired range. What do the results mean?  Total cholesterol is a rough measure of all the cholesterol in your blood.  LDL (low-density lipoprotein) is the "bad" cholesterol. This is the type that causes plaque to build up on the artery walls. You want this level to be low.  HDL (high-density lipoprotein) is the "good" cholesterol because it cleans the arteries and carries the LDL away. You want this level to be high.  Triglycerides are fat that the body can either burn for energy or store. High levels are closely linked to heart disease. What are the desired levels of cholesterol?  Total cholesterol below 200.  LDL below 100 for people who are at risk, below 70 for people at very high risk.  HDL above 40 is good. A level of 60 or higher is considered to be protective against heart  disease.  Triglycerides below 150. How can I lower my cholesterol? Diet Follow your diet program as told by your health care provider.  Choose fish or white meat chicken and [place], roasted or baked. Limit fatty cuts of red meat, fried foods, and processed meats, such as sausage and lunch meats.  Eat lots of fresh fruits and vegetables.  Choose whole grains, beans, pasta, potatoes, and cereals.  Choose olive oil, corn oil, or canola oil, and use only small amounts.  Avoid butter, mayonnaise, shortening, or palm kernel oils.  Avoid foods with trans fats.  Drink skim or nonfat milk and eat low-fat or nonfat yogurt and cheeses. Avoid whole milk, cream, ice cream, egg yolks, and full-fat cheeses.  Healthier desserts include angel food cake, ginger snaps, animal crackers, hard candy, popsicles, and low-fat or nonfat frozen yogurt. Avoid pastries, cakes, pies, and cookies.  Exercise  Follow your exercise program as told by your health care provider. A regular program: ? Helps to decrease LDL  and raise HDL. ? Helps with weight control.  Do things that increase your activity level, such as gardening, walking, and taking the stairs.  Ask your health care provider about ways that you can be more active in your daily life.  Medicine  Take over-the-counter and prescription medicines only as told by your health care provider. ? Medicine may be prescribed by your health care provider to help lower cholesterol and decrease the risk for heart disease. This is usually done if diet and exercise have failed to bring down cholesterol levels. ? If you have several risk factors, you may need medicine even if your levels are normal.  This information is not intended to replace advice given to you by your health care provider. Make sure you discuss any questions you have with your health care provider. Document Released: 05/21/2001 Document Revised: 03/23/2016 Document Reviewed: 02/24/2016 Elsevier  Interactive Patient Education  2018 [place] What is domestic violence? Domestic violence, also called intimate partner violence, can involve physical, emotional, psychological, sexual, and economic abuse by a current or former intimate partner. Stalking is also considered a type of domestic violence. Domestic violence can happen between people who are or were:  Married.  Dating.  Living together.  Abusers repeatedly act to maintain control and power over their partner. Physical abuse Physical abuse can include:  Slapping.  Hitting.  Kicking.  Punching.  Choking.  Pulling the victim's hair.  Damaging the victim's property.  Threatening or hurting the victim with weapons.  Trapping the victim in his or her home.  Forcing the victim to use drugs or alcohol.  Emotional and psychological abuse Emotional and psychological abuse can include:  Threats.  Insults.  Isolation.  Humiliation.  Jealousy and possessiveness.  Blame.  Withholding affection.  Intimidation.  Manipulation.  Limiting contact with friends and family.  Sexual abuse Sexual abuse can include:  Forcing sex.  Forcing sexual touching.  Hurting the victim during sex.  Forcing the victim to have sex with other people.  Giving the victim a sexually transmitted disease (STD) on purpose.  Economic abuse Economic abuse can include:  Controlling resources, such as money, food, transportation, a phone, or computer.  Stealing money from the victim or his or her family or friends.  Forbidding the victim to work.  Refusing to work or to contribute to the household.  Stalking Stalking can include:  Making repeated, unwanted phone calls, emails, or text messages.  Leaving cards, letters, flowers or other items the victim does not want.  Watching or following the victim from a distance.  Going to places where the victim does not want the  abuser.  Entering the victim's home or car.  Damaging the victim's personal property.  What are some warning signs of domestic violence? Physical signs  Bruises.  Broken bones.  Burns or cuts.  Physical pain.  Head injury. Emotional and psychological signs  Crying.  Depression.  Hopelessness.  Desperation.  Trouble sleeping.  Fear of partner.  Anxiety.  Suicidal behavior.  Antisocial behavior.  Low self-esteem.  Fear of intimacy.  Flashbacks. Sexual signs  Bruising, swelling, or bleeding of the genital or rectal area.  Signs of a sexually transmitted infection, such as genital sores, warts, or discharge coming from the genital area.  Pain in the genital area.  Unintended pregnancy.  Problems with pregnancy, including delayed prenatal care and prematurity. Economic signs  Having little money or food.  Homelessness.  Asking for or borrowing money.  What are common behaviors of those affected by domestic violence? Those affected by domestic violence may:  Be late to work or other events.  Not show up to places as promised.  Have to let their partner know where they are and who they are with.  Be isolated or kept from seeing friends or family.  Make comments about their partner's temper or behavior.  Make excuses for their partner.  Engage in high-risk sexual behaviors.  Use drugs or alcohol.  Have unhealthy diet-related behaviors.  What are common feelings of those affected by domestic violence? Those affected by domestic violence may feel that:  They have to be careful not to say or do things that trigger their partner's anger.  They cannot do anything right.  They deserve to be treated badly.  They are overreacting to their partner's behavior or temper.  They cannot trust their own feelings.  They cannot trust other people.  They are trapped.  Their partner would take away their children.  They are emotionally drained  or numb.  Their life is in danger.  They might have to kill their partner to survive.  Where can you get help? Domestic violence hotlines and websites If you do not feel safe searching for help online at home, use a computer at [facility] to access [employer]. Call 911 if you are in immediate danger or need medical help.  The [employer]. ? The 24-hour phone hotline is 415-866-9806 or 5413272096 (TTY). ? The videophone is available Monday through Friday, 9 a.m. to 5 p.m. Call [phone]. ? The website is http://thehotline.org  The National Sexual Assault Hotline. ? The 24-hour phone hotline is (424)664-7320. ? You can access the online hotline at http://www.dixon.info/  Shelters for victims of domestic violence If you are a victim of domestic violence, there are resources to help you find a temporary place for you and your children to live (shelter). The specific address of these shelters is often not known to the public. Police Report assaults, threats, and stalking to the police. Counselors and counseling centers People who have been victims of domestic violence can benefit from counseling. Counseling can help you cope with difficult emotions and empower you to plan for your future safety. The topics you discuss with a counselor are private and confidential. Children of domestic violence victims also might need counseling to manage stress and anxiety. The court system You can work with a Chief Executive Officer or an advocate to get legal protection against an abuser. Protection includes restraining orders and private addresses. Crimes against you, such as assault, can also be prosecuted through the courts. Laws vary by state. This information is not intended to replace advice given to you by your health care provider. Make sure you discuss any questions you have with your health care provider. Document Released: 11/16/2003 Document Revised: 08/09/2016  Document Reviewed: 05/13/2014 Elsevier Interactive Patient Education  2018 [employer].

## 2017-12-11 NOTE — Progress Notes (Signed)
Pre visit review using our clinic review tool, if applicable. No additional management support is needed unless otherwise documented below in the visit note. 

## 2017-12-12 ENCOUNTER — Telehealth: Payer: Self-pay | Admitting: Obstetrics and Gynecology

## 2017-12-12 LAB — HEPATITIS C ANTIBODY
Hepatitis C Ab: NONREACTIVE
SIGNAL TO CUT-OFF: 0.02 (ref <1.00)

## 2017-12-12 LAB — URINE CYTOLOGY ANCILLARY ONLY
Chlamydia: NEGATIVE
Neisseria Gonorrhea: NEGATIVE
Trichomonas: NEGATIVE

## 2017-12-12 LAB — HIV ANTIBODY (ROUTINE TESTING W REFLEX): HIV 1&2 Ab, 4th Generation: NONREACTIVE

## 2017-12-12 LAB — HSV 1 ANTIBODY, IGG: HSV 1 Glycoprotein G Ab, IgG: <0.9 index

## 2017-12-12 LAB — RPR: RPR Ser Ql: NONREACTIVE

## 2017-12-12 LAB — HEPATITIS B SURFACE ANTIGEN: Hepatitis B Surface Ag: NONREACTIVE

## 2017-12-12 LAB — HSV 2 ANTIBODY, IGG: HSV 2 Glycoprotein G Ab, IgG: 15.6 index — ABNORMAL HIGH

## 2017-12-12 LAB — HEPATITIS B SURFACE ANTIBODY, QUANTITATIVE: Hepatitis B-Post: 17 m[IU]/mL (ref >=10)

## 2017-12-12 NOTE — Telephone Encounter (Signed)
[facility] referring for Dr. Georgianne Fick abnormal menses heavy and long x 10-11 bleeding. Unable to leave voicemail due to voicemail box full

## 2017-12-14 ENCOUNTER — Encounter: Payer: Self-pay | Admitting: Internal Medicine

## 2017-12-14 DIAGNOSIS — N938 Other specified abnormal uterine and vaginal bleeding: Secondary | ICD-10-CM | POA: Insufficient documentation

## 2017-12-14 DIAGNOSIS — S90822A Blister (nonthermal), left foot, initial encounter: Secondary | ICD-10-CM | POA: Insufficient documentation

## 2017-12-14 DIAGNOSIS — F419 Anxiety disorder, unspecified: Secondary | ICD-10-CM | POA: Insufficient documentation

## 2017-12-14 DIAGNOSIS — F32A Depression, unspecified: Secondary | ICD-10-CM | POA: Insufficient documentation

## 2017-12-14 DIAGNOSIS — F39 Unspecified mood [affective] disorder: Secondary | ICD-10-CM | POA: Insufficient documentation

## 2017-12-14 DIAGNOSIS — F329 Major depressive disorder, single episode, unspecified: Secondary | ICD-10-CM | POA: Insufficient documentation

## 2017-12-14 DIAGNOSIS — F5104 Psychophysiologic insomnia: Secondary | ICD-10-CM | POA: Insufficient documentation

## 2017-12-14 DIAGNOSIS — K219 Gastro-esophageal reflux disease without esophagitis: Secondary | ICD-10-CM | POA: Insufficient documentation

## 2017-12-14 DIAGNOSIS — G4700 Insomnia, unspecified: Secondary | ICD-10-CM | POA: Insufficient documentation

## 2017-12-14 DIAGNOSIS — J302 Other seasonal allergic rhinitis: Secondary | ICD-10-CM | POA: Insufficient documentation
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
## 2017-12-15 ENCOUNTER — Other Ambulatory Visit: Payer: Self-pay | Admitting: Internal Medicine

## 2017-12-15 DIAGNOSIS — N760 Acute vaginitis: Principal | ICD-10-CM

## 2017-12-15 DIAGNOSIS — B9689 Other specified bacterial agents as the cause of diseases classified elsewhere: Secondary | ICD-10-CM

## 2017-12-15 LAB — URINE CYTOLOGY ANCILLARY ONLY: Bacterial vaginitis: POSITIVE — AB

## 2017-12-15 MED ORDER — METRONIDAZOLE 500 MG PO TABS
500.0000 mg | ORAL_TABLET | Freq: Two times a day (BID) | ORAL | 0 refills | Status: DC
Start: 2017-12-15 — End: 2018-01-26

## 2017-12-17 NOTE — Telephone Encounter (Signed)
Unable to leave voicemail due to voicemail box full

## 2017-12-18 NOTE — Telephone Encounter (Signed)
Voicemail box is full unable to leave voice message

## 2018-01-05 ENCOUNTER — Ambulatory Visit: Payer: Self-pay | Admitting: Obstetrics and Gynecology

## 2018-01-22 ENCOUNTER — Ambulatory Visit: Payer: BLUE CROSS/BLUE SHIELD | Admitting: Internal Medicine

## 2018-01-26 ENCOUNTER — Encounter: Payer: Self-pay | Admitting: Obstetrics and Gynecology

## 2018-01-26 ENCOUNTER — Ambulatory Visit (INDEPENDENT_AMBULATORY_CARE_PROVIDER_SITE_OTHER): Payer: BLUE CROSS/BLUE SHIELD | Admitting: Obstetrics and Gynecology

## 2018-01-26 VITALS — BP 130/82 | HR 94 | Ht 64.0 in | Wt 212.0 lb

## 2018-01-26 DIAGNOSIS — D251 Intramural leiomyoma of uterus: Secondary | ICD-10-CM | POA: Diagnosis not present

## 2018-01-26 NOTE — Progress Notes (Signed)
Gynecology Abnormal Uterine Bleeding Initial Evaluation   Chief Complaint:  Chief Complaint  Patient presents with  . Fibroids    referred by Blanche East     History of Present Illness:    Paitient is a 47 y.o. [id number] who LMP was Patient's last menstrual period was 06/13/2017., presents today for a problem visit.  She complains of menorrhagia that  began several years ago and its severity is described as severe.  Menses have been at irregular intervals but she has also been enrolled in a fibroid study and placed on medication.  It is unclear if she was actually receiving drug or blinded and receiving placebo.  Her cycles are associated with moderate menstrual cramping.  She has used the following for attempts at control: tampon and pad.   Previous evaluation: TVUS 10/2015 showing R intramural fibroid measuring 2.7 x 2.5 x 2.3cm and left intramural fibroid measuing 4.3 x 2.7 x 3.8cm. Endometrial biopsy 07/2017 showing inactive endometrium, and pap 02/14/2016 NIL. Prior Diagnosis: AUB-L. Previous Treatment: NSAID, and was enrolled in a fibroid study.  LMP: Patient's last menstrual period was 06/13/2017.  Review of Systems: Review of Systems  Constitutional: Negative for chills and fever.  HENT: Negative for congestion.   Respiratory: Negative for cough and shortness of breath.   Cardiovascular: Negative for chest pain and palpitations.  Gastrointestinal: Negative for abdominal pain, constipation, diarrhea, heartburn, nausea and vomiting.  Genitourinary: Negative for dysuria, frequency and urgency.  Skin: Negative for itching and rash.  Neurological: Negative for dizziness and headaches.  Endo/Heme/Allergies: Negative for polydipsia.  Psychiatric/Behavioral: Negative for depression.    Past Medical History:  Past Medical History:  Diagnosis Date  . Allergy   . Anxiety   . Arthritis   . Chicken pox   . Depression   . GERD (gastroesophageal reflux disease)   . Headache   .  Hyperlipidemia   . Hypertension   . Uterine fibroid   . UTI (urinary tract infection)     Past Surgical History:  Past Surgical History:  Procedure Laterality Date  . CARPAL TUNNEL RELEASE     right 06/2017   . ENDOMETRIAL BIOPSY     neg  . EYE SURGERY Bilateral 2001  . TUBAL LIGATION     1999    Obstetric History: [id number]  Family History:  Family History  Problem Relation Age of Onset  . Healthy Mother   . Arthritis Father   . Hypertension Father   . Healthy Brother   . Arthritis Brother   . Hypertension Brother   . Stroke Brother 30    Social History:  Social History   Socioeconomic History  . Marital status: Single    Spouse name: Not on file  . Number of children: 3  . Years of education: Not on file  . Highest education level: Not on file  Occupational History  . Occupation: [employer]  Social Needs  . Financial resource strain: Not on file  . Food insecurity:    Worry: Not on file    Inability: Not on file  . Transportation needs:    Medical: Not on file    Non-medical: Not on file  Tobacco Use  . Smoking status: Light Tobacco Smoker    Types: Cigarettes  . Smokeless tobacco: Never Used  Substance and Sexual Activity  . Alcohol use: Yes  . Drug use: No  . Sexual activity: Yes  Lifestyle  . Physical activity:    Days per week: 0  days    Minutes per session: 0 min  . Stress: Very much  Relationships  . Social connections:    Talks on phone: Not on file    Gets together: Not on file    Attends religious service: Not on file    Active member of club or organization: Not on file    Attends meetings of clubs or organizations: Not on file    Relationship status: Not on file  . Intimate partner violence:    Fear of current or ex partner: Not on file    Emotionally abused: Not on file    Physically abused: Not on file    Forced sexual activity: Not on file  Other Topics Concern  . Not on file  Social History Narrative   Teacher, early years/pre    Some college    Single lives with boyfriend    Daughters, 4 kids total    No guns, wears seat belt     Allergies:  Allergies  Allergen Reactions  . Bee Venom Swelling    Anaphylaxis   . Pollen Extract     Medications: Prior to Admission medications   Medication Sig Start Date End Date Taking? Authorizing Provider  omeprazole (PRILOSEC) 20 MG capsule Take 1 capsule (20 mg total) by mouth daily. On empty stomach 12/11/17  Yes McLean-Scocuzza, Nino Glow, MD  sertraline (ZOLOFT) 100 MG tablet Take 1.5 tablets (150 mg total) by mouth daily. 12/11/17 12/11/18 Yes McLean-Scocuzza, Nino Glow, MD    Physical Exam Blood pressure 130/82, pulse 94, height 5\' 4"  (1.626 m), weight 212 lb (96.2 kg), last menstrual period 06/13/2017.  Patient's last menstrual period was 06/13/2017.  General: NAD HEENT: normocephalic, anicteric Thyroid: no enlargement, no palpable nodules Pulmonary: No increased work of breathing Cardiovascular: RRR, distal pulses 2+ Abdomen: NABS, soft, non-tender, non-distended.  Umbilicus without lesions.  No hepatomegaly, splenomegaly or masses palpable. No evidence of hernia  Genitourinary:  External: Normal external female genitalia.  Normal urethral meatus, normal Bartholin's and Skene's glands.    Vagina: Normal vaginal mucosa, no evidence of prolapse.    Cervix: Grossly normal in appearance, no bleeding, good descensus   Uterus: Enlarged 12 week size, mobile, irregular contour with at least one anterior fibroid felt between bladder and uterus, and one left lateral fibroid.  No CMT  Adnexa: ovaries non-enlarged, no adnexal masses  Rectal: deferred  Lymphatic: no evidence of inguinal lymphadenopathy Extremities: no edema, erythema, or tenderness Neurologic: Grossly intact Psychiatric: mood appropriate, affect full  Female chaperone present for pelvic portions of the physical exam  Assessment: 47 y.o. [phone] with abnormal uterine bleeding secondary to uterine  fibroids  Plan: Problem List Items Addressed This Visit      Genitourinary   Fibroid uterus - Primary   Relevant Orders   US Transvaginal Non-OB      1) Discussed management options for abnormal uterine bleeding including expectant, NSAIDs, tranexamic acid (Lysteda), oral progesterone (Provera, norethindrone, megace), Depo Provera, Levonorgestrel containing IUD,  [place], or hysterectomy as definitive surgical management.  Not candidate for combination OCP given HTN, age, and migraines with aura.  Discussed risks and benefits of each method.  The role of unopposed estrogen in the development of endometrial hyperplasia or carcinoma is discussed.  The risk of endometrial hyperplasia is linearly correlated with increasing BMI given the production of estrone by adipose tissue. The patient is not interested in IUD placement which has previously been offered to her at [facility] - known fibroids has  been on study in [place], it sound like may a [place] anatagonist which would also be consistent with her endometrial biopsy in November of 2018 showing inactive endometrium - last [place] and biopsy at [facility] reviewed - Pap up to date 02/12/2016 NIL - review records - interested in proceeding with hysterectomy - up to 12 day menses  2) A total of 20 minutes were spent in face-to-face contact with the patient during this encounter with over half of that time devoted to counseling and coordination of care.  2) Return in about 2 weeks (around 02/09/2018) for GYN TVUS and follow up.   Malachy Mood, MD, [facility], [facility] 01/26/2018, 10:17 AM

## 2018-01-28 ENCOUNTER — Ambulatory Visit (INDEPENDENT_AMBULATORY_CARE_PROVIDER_SITE_OTHER): Payer: BLUE CROSS/BLUE SHIELD

## 2018-01-28 ENCOUNTER — Encounter: Payer: Self-pay | Admitting: Internal Medicine

## 2018-01-28 ENCOUNTER — Ambulatory Visit (INDEPENDENT_AMBULATORY_CARE_PROVIDER_SITE_OTHER): Payer: BLUE CROSS/BLUE SHIELD | Admitting: Internal Medicine

## 2018-01-28 VITALS — BP 148/96 | HR 91 | Temp 98.4°F | Ht 64.0 in | Wt 210.6 lb

## 2018-01-28 DIAGNOSIS — M25521 Pain in right elbow: Secondary | ICD-10-CM | POA: Diagnosis not present

## 2018-01-28 DIAGNOSIS — B9689 Other specified bacterial agents as the cause of diseases classified elsewhere: Secondary | ICD-10-CM

## 2018-01-28 DIAGNOSIS — K219 Gastro-esophageal reflux disease without esophagitis: Secondary | ICD-10-CM | POA: Diagnosis not present

## 2018-01-28 DIAGNOSIS — I10 Essential (primary) hypertension: Secondary | ICD-10-CM

## 2018-01-28 DIAGNOSIS — T7491XD Unspecified adult maltreatment, confirmed, subsequent encounter: Secondary | ICD-10-CM

## 2018-01-28 DIAGNOSIS — M25531 Pain in right wrist: Secondary | ICD-10-CM | POA: Insufficient documentation

## 2018-01-28 DIAGNOSIS — D251 Intramural leiomyoma of uterus: Secondary | ICD-10-CM | POA: Diagnosis not present

## 2018-01-28 DIAGNOSIS — F419 Anxiety disorder, unspecified: Secondary | ICD-10-CM | POA: Diagnosis not present

## 2018-01-28 DIAGNOSIS — B353 Tinea pedis: Secondary | ICD-10-CM | POA: Diagnosis not present

## 2018-01-28 DIAGNOSIS — F329 Major depressive disorder, single episode, unspecified: Secondary | ICD-10-CM | POA: Diagnosis not present

## 2018-01-28 DIAGNOSIS — F32A Depression, unspecified: Secondary | ICD-10-CM

## 2018-01-28 DIAGNOSIS — G4700 Insomnia, unspecified: Secondary | ICD-10-CM

## 2018-01-28 DIAGNOSIS — N760 Acute vaginitis: Secondary | ICD-10-CM

## 2018-01-28 MED ORDER — TEMAZEPAM 15 MG PO CAPS: 15.0000 mg | ORAL_CAPSULE | Freq: Every evening | ORAL | 0 refills | Status: DC | PRN

## 2018-01-28 MED ORDER — METHYLPREDNISOLONE ACETATE 40 MG/ML IJ SUSP
40.0000 mg | Freq: Once | INTRAMUSCULAR | Status: AC
  Administered 2018-01-28: 40 mg via INTRAMUSCULAR

## 2018-01-28 MED ORDER — HYDROCORTISONE 2.5 % EX OINT: TOPICAL_OINTMENT | Freq: Two times a day (BID) | CUTANEOUS | 1 refills | Status: DC

## 2018-01-28 MED ORDER — OMEPRAZOLE 20 MG PO CPDR: 20.0000 mg | DELAYED_RELEASE_CAPSULE | Freq: Every day | ORAL | 5 refills | Status: DC

## 2018-01-28 MED ORDER — METRONIDAZOLE 500 MG PO TABS
500.0000 mg | ORAL_TABLET | Freq: Two times a day (BID) | ORAL | 0 refills | Status: DC
Start: 2018-01-28 — End: 2018-05-08

## 2018-01-28 MED ORDER — CLOTRIMAZOLE 1 % EX CREA: 1.0000 "application " | TOPICAL_CREAM | Freq: Two times a day (BID) | CUTANEOUS | 1 refills | Status: DC

## 2018-01-28 MED ORDER — SERTRALINE HCL 100 MG PO TABS: 150.0000 mg | ORAL_TABLET | Freq: Every day | ORAL | 11 refills | Status: DC

## 2018-01-28 MED ORDER — AMLODIPINE BESYLATE 2.5 MG PO TABS: 2.5000 mg | ORAL_TABLET | Freq: Every day | ORAL | 1 refills | Status: DC

## 2018-01-28 MED ORDER — CLOTRIMAZOLE-BETAMETHASONE 1-0.05 % EX CREA: 1.0000 "application " | TOPICAL_CREAM | Freq: Two times a day (BID) | CUTANEOUS | 0 refills | Status: DC

## 2018-01-28 NOTE — Progress Notes (Signed)
Pre visit review using our clinic review tool, if applicable. No additional management support is needed unless otherwise documented below in the visit note. 

## 2018-01-28 NOTE — Progress Notes (Addendum)
Chief Complaint  Patient presents with  . Follow-up   F/u  1. HTN not on meds  2. Mood d/o depression/anxiety stress insomnia phq 9 22 been off zoloft 150 mg qd since Friday due to out of meds  3. C/o right wrist and elbow pain and swelling x 2-3 weeks  4. Reviewed labs last visit BV + never took medication  5. DUB pending hysterectomy OB/GYN   Review of Systems  Constitutional: Negative for weight loss.  HENT: Negative for hearing loss.   Eyes: Negative for blurred vision.  Respiratory: Negative for shortness of breath.   Cardiovascular: Negative for chest pain.  Musculoskeletal: Positive for joint pain.  Skin: Positive for rash.  Neurological: Positive for headaches.  Psychiatric/Behavioral: Positive for depression. Negative for suicidal ideas. The patient is nervous/anxious and has insomnia.    Past Medical History:  Diagnosis Date  . Allergy   . Anxiety   . Arthritis   . Chicken pox   . Depression   . GERD (gastroesophageal reflux disease)   . Headache   . Hyperlipidemia   . Hypertension   . Uterine fibroid   . UTI (urinary tract infection)    Past Surgical History:  Procedure Laterality Date  . CARPAL TUNNEL RELEASE     right 06/2017   . ENDOMETRIAL BIOPSY     neg  . EYE SURGERY Bilateral 2001  . TUBAL LIGATION     1999   Family History  Problem Relation Age of Onset  . Healthy Mother   . Arthritis Father   . Hypertension Father   . Healthy Brother   . Arthritis Brother   . Hypertension Brother   . Stroke Brother 30   Social History   Socioeconomic History  . Marital status: Single    Spouse name: Not on file  . Number of children: 3  . Years of education: Not on file  . Highest education level: Not on file  Occupational History  . Occupation: [employer]  Social Needs  . Financial resource strain: Not on file  . Food insecurity:    Worry: Not on file    Inability: Not on file  . Transportation needs:    Medical: Not on file    Non-medical:  Not on file  Tobacco Use  . Smoking status: Light Tobacco Smoker    Types: Cigarettes  . Smokeless tobacco: Never Used  Substance and Sexual Activity  . Alcohol use: Yes  . Drug use: No  . Sexual activity: Yes  Lifestyle  . Physical activity:    Days per week: 0 days    Minutes per session: 0 min  . Stress: Very much  Relationships  . Social connections:    Talks on phone: Not on file    Gets together: Not on file    Attends religious service: Not on file    Active member of club or organization: Not on file    Attends meetings of clubs or organizations: Not on file    Relationship status: Not on file  . Intimate partner violence:    Fear of current or ex partner: Not on file    Emotionally abused: Not on file    Physically abused: Not on file    Forced sexual activity: Not on file  Other Topics Concern  . Not on file  Social History Narrative   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 lives with boyfriend    Daughters, 4 kids  total    No guns, wears seat belt    Current Meds  Medication Sig  . omeprazole (PRILOSEC) 20 MG capsule Take 1 capsule (20 mg total) by mouth daily. On empty stomach  . sertraline (ZOLOFT) 100 MG tablet Take 1.5 tablets (150 mg total) by mouth daily.   Current Facility-Administered Medications for the 01/28/18 encounter (Office Visit) with McLean-Scocuzza, Nino Glow, MD  Medication  . betamethasone acetate-betamethasone sodium phosphate (CELESTONE) injection 3 mg   Allergies  Allergen Reactions  . Bee Venom Swelling    Anaphylaxis   . Pollen Extract    Recent Results (from the past 2160 hour(s))  Urine cytology ancillary only     Status: None   Collection Time: 12/11/17 12:00 AM  Result Value Ref Range   Chlamydia Negative     Comment: Normal Reference Range - Negative   Neisseria gonorrhea Negative     Comment: Normal Reference Range - Negative   Trichomonas Negative     Comment: Normal Reference Range - Negative  Urine  cytology ancillary only     Status: Abnormal   Collection Time: 12/11/17 12:00 AM  Result Value Ref Range   Bacterial vaginitis **POSITIVE for Gardnerella vaginalis** (A)     Comment: Normal Reference Range - Negative  RPR     Status: None   Collection Time: 12/11/17 12:01 PM  Result Value Ref Range   RPR Ser Ql NON-REACTIVE NON-REACTI  HIV antibody (with reflex)     Status: None   Collection Time: 12/11/17 12:01 PM  Result Value Ref Range   HIV 1&2 Ab, 4th Generation NON-REACTIVE NON-REACTI    Comment: HIV-1 antigen and HIV-1/HIV-2 antibodies were not detected. There is no laboratory evidence of HIV infection. Marland Kitchen PLEASE NOTE: This information has been disclosed to you from records whose confidentiality may be protected by state law.  If your state requires such protection, then the state law prohibits you from making any further disclosure of the information without the specific written consent of the person to whom it pertains, or as otherwise permitted by law. A general authorization for the release of medical or other information is NOT sufficient for this purpose. . For additional information please refer to http://education.[facility].com/faq/FAQ106 (This link is being provided for informational/ educational purposes only.) . Marland Kitchen The performance of this assay has not been clinically validated in patients less than 47 years old. .   Hepatitis B surface antigen     Status: None   Collection Time: 12/11/17 12:01 PM  Result Value Ref Range   Hepatitis B Surface Ag NON-REACTIVE NON-REACTI  Hepatitis B surface antibody     Status: None   Collection Time: 12/11/17 12:01 PM  Result Value Ref Range   Hepatitis B-Post 17 > OR = 10 mIU/mL    Comment: . Patient has immunity to hepatitis B virus. . For additional information, please refer to http://education.[facility].com/faq/FAQ105 (This link is being provided for informational/ educational purposes only).     Hepatitis C antibody     Status: None   Collection Time: 12/11/17 12:01 PM  Result Value Ref Range   Hepatitis C Ab NON-REACTIVE NON-REACTI   SIGNAL TO CUT-OFF 0.02 <1.00    Comment: . HCV antibody was non-reactive. There is no laboratory  evidence of HCV infection. . In most cases, no further action is required. However, if recent HCV exposure is suspected, a test for HCV RNA (test code 340 [id number]) is suggested. . For additional information please refer to http://education.[facility].com/faq/FAQ22v1 (This link  is being provided for informational/ educational purposes only.) .   HSV 1 antibody, IgG     Status: None   Collection Time: 12/11/17 12:01 PM  Result Value Ref Range   HSV 1 Glycoprotein G Ab, IgG <0.90 index    Comment:                           Index          Interpretation                           -----          --------------                           <0.90          Negative                           0.90-1.09      Equivocal                           >1.09          Positive . This assay utilizes recombinant type-specific antigens to differentiate HSV-1 from HSV-2 infections. A positive result cannot distinguish between recent and past infection. If recent HSV infection is suspected but the results are negative or equivocal, the assay should be repeated in 4-6 weeks. The performance characteristics of the assay have not been established for pediatric populations, immunocompromised patients, or neonatal screening.   HSV 2 antibody, IgG     Status: Abnormal   Collection Time: 12/11/17 12:01 PM  Result Value Ref Range   HSV 2 Glycoprotein G Ab, IgG 15.60 (H) index    Comment:                           Index          Interpretation                           -----          --------------                           <0.90          Negative                           0.90-1.09      Equivocal                           >1.09          Positive . This assay utilizes  recombinant type-specific antigens to differentiate HSV-1 from HSV-2 infections. A positive result cannot distinguish between recent and past infection. If recent HSV infection is suspected but the results are negative or equivocal, the assay should be repeated in 4-6 weeks. The performance characteristics of the assay have not been established for pediatric populations, immunocompromised patients, or neonatal screening.    Objective  Body mass index is 36.15 kg/m. Wt Readings from Last 3 Encounters:  01/28/18 210 lb 9.6 oz (95.5 kg)  01/26/18  212 lb (96.2 kg)  12/11/17 225 lb 12.8 oz (102.4 kg)   Temp Readings from Last 3 Encounters:  01/28/18 98.4 F (36.9 C) (Oral)  12/11/17 98.2 F (36.8 C) (Oral)  10/07/16 98 F (36.7 C) (Oral)   BP Readings from Last 3 Encounters:  01/28/18 (!) 148/96  01/26/18 130/82  12/11/17 140/70   Pulse Readings from Last 3 Encounters:  01/28/18 91  01/26/18 94  12/11/17 78    Physical Exam  Constitutional: She is oriented to person, place, and time. She appears well-developed and well-nourished. She is cooperative.  HENT:  Head: Normocephalic and atraumatic.  Mouth/Throat: Oropharynx is clear and moist and mucous membranes are normal.  Eyes: Pupils are equal, round, and reactive to light. Conjunctivae are normal.  Cardiovascular: Normal rate, regular rhythm and normal heart sounds.  Pulmonary/Chest: Effort normal and breath sounds normal.  Musculoskeletal:       Right elbow: She exhibits swelling. Tenderness found.       Right wrist: She exhibits tenderness and bony tenderness.  Neurological: She is alert and oriented to person, place, and time. Gait normal.  Skin: Skin is warm, dry and intact.     Scaly lesion left foot medially   Psychiatric: She has a normal mood and affect. Her speech is normal and behavior is normal. Judgment and thought content normal. Cognition and memory are normal.  Nursing note and vitals  reviewed.   Assessment   1. HTN  2. Depression, anxiety/insomnia/ DV 3. Right wrist and elbow pain x 2-3 weeks s/p CTS right wrist 06/2017 4. Bacterial vaginosis  5. DUB 6. HM 7. ?tinea left foot  Plan  1.  norvasc 2.5 mg qd  2. zoloft 150 mg qd  [facility]  Rx restoril 15 mg qhs sleep   3. Xrays x 2 today  Counter force brace  otc meds  Depo 40 x 1 today 4. Flagyl  5. Pending hysterectomy  6.  Flu shot not had 2018  Tdap had   Colonoscopy last had 2012 unable to see this record   Mammogram per pt had 05/2017 [facility]  [place] signed release to get records [facility] ordered mammogram 07/2017 so unknown if she had per pt had 07/2017   Pap [facility] [place] -pap 02/12/16 neg see care everywhere    7. lotrisone vs clotrimazole/hc    Pt to call and sch podiatry appt  Provider: Dr. Olivia Mackie McLean-Scocuzza-Internal Medicine

## 2018-01-28 NOTE — Patient Instructions (Addendum)
Follow up in 6 weeks   [facility] [place]  [id number] [phone] [place]   Dr. Samara Deist please call and schedule appt [phone] foot doctor please call and schedule   Lotrisone cream 2x per day for foot or hydrocortisone/clotrimzole     Athlete's Foot Athlete's foot (tinea pedis) is a fungal infection of the skin on the feet. It often occurs on the skin that is between or underneath the toes. It can also occur on the soles of the feet. The infection can spread from person to person (is contagious). Follow these instructions at home:  Apply or take over-the-counter and prescription medicines only as told by your doctor.  Keep all follow-up visits as told by your doctor. This is important.  Do not scratch your feet.  Keep your feet dry: ? Wear cotton or wool socks. Change your socks every day or if they become wet. ? Wear shoes that allow air to move around, such as sandals or canvas tennis shoes.  Wash and dry your feet: ? Every day or as told by your doctor. ? After exercising. ? Including the area between your toes.  Wear sandals in wet areas, such as locker rooms and shared showers.  Do not share any of these items: ? Towels. ? Nail clippers. ? Other personal items that touch your feet.  If you have diabetes, keep your blood sugar under control. Contact a doctor if:  You have a fever.  You have swelling, soreness, warmth, or redness in your foot.  You are not getting better with treatment.  Your symptoms get worse.  You have new symptoms. This information is not intended to replace advice given to you by your health care provider. Make sure you discuss any questions you have with your health care provider. Document Released: 02/12/2008 Document Revised: 02/01/2016 Document Reviewed: 02/27/2015 Elsevier Interactive Patient Education  2018 [place].   Plantar Fasciitis Plantar fasciitis is a painful foot condition that affects the heel. It occurs when the band of tissue that connects the toes to the heel bone (plantar fascia) becomes irritated. This can happen after exercising too much or doing other repetitive activities (overuse injury). The pain from plantar fasciitis can range from mild irritation to severe pain that makes it difficult for you to walk or move. The pain is usually worse in the morning or after you have been sitting or lying down for a while. What are the causes? This condition may be caused by:  Standing for long periods of time.  Wearing shoes that do not fit.  Doing high-impact activities, including running, aerobics, and ballet.  Being overweight.  Having an abnormal way of walking (gait).  Having tight calf muscles.  Having high arches in your feet.  Starting a new athletic activity.  What are the signs or symptoms? The main symptom of this condition is heel pain. Other symptoms include:  Pain that gets worse after activity or exercise.  Pain that is worse in the morning or after resting.  Pain that goes away after you walk for a few minutes.  How is this diagnosed? This condition may be diagnosed based on your signs and symptoms. Your health care provider will also do a physical exam to check for:  A tender area on the bottom of your foot.  A high arch in your foot.  Pain when you move your foot.  Difficulty moving your foot.  You may also need to have imaging studies to confirm the diagnosis. These can include:  X-rays.  Ultrasound.  MRI.  How is this treated? Treatment for plantar fasciitis depends on the severity of the condition. Your treatment may include:  Rest, ice, and over-the-counter pain medicines to manage your pain.  Exercises to stretch your calves and your plantar fascia.  A splint that holds your foot in a stretched, upward position while you sleep (night  splint).  Physical therapy to relieve symptoms and prevent problems in the future.  Cortisone injections to relieve severe pain.  Extracorporeal shock wave therapy (ESWT) to stimulate damaged plantar fascia with electrical impulses. It is often used as a last resort before surgery.  Surgery, if other treatments have not worked after 12 months.  Follow these instructions at home:  Take medicines only as directed by your health care provider.  Avoid activities that cause pain.  Roll the bottom of your foot over a bag of ice or a bottle of cold water. Do this for 20 minutes, 3-4 times a day.  Perform simple stretches as directed by your health care provider.  Try wearing athletic shoes with air-sole or gel-sole cushions or soft shoe inserts.  Wear a night splint while sleeping, if directed by your health care provider.  Keep all follow-up appointments with your health care provider. How is this prevented?  Do not perform exercises or activities that cause heel pain.  Consider finding low-impact activities if you continue to have problems.  Lose weight if you need to. The best way to prevent plantar fasciitis is to avoid the activities that aggravate your plantar fascia. Contact a health care provider if:  Your symptoms do not go away after treatment with home care measures.  Your pain gets worse.  Your pain affects your ability to move or do your daily activities. This information is not intended to replace advice given to you by your health care provider. Make sure you discuss any questions you have with your health care provider. Document Released: 05/21/2001 Document Revised: 01/29/2016 Document Reviewed: 07/06/2014 Elsevier Interactive Patient Education  [employer].

## 2018-01-28 NOTE — Addendum Note (Signed)
Addended by: Orland Mustard on: 01/28/2018 03:13 PM   Modules accepted: Orders

## 2018-01-28 NOTE — Addendum Note (Signed)
Addended by: Elpidio Galea T on: 01/28/2018 10:21 AM   Modules accepted: Orders

## 2018-02-12 ENCOUNTER — Ambulatory Visit: Payer: BLUE CROSS/BLUE SHIELD | Admitting: Obstetrics and Gynecology

## 2018-02-12 ENCOUNTER — Other Ambulatory Visit: Payer: BLUE CROSS/BLUE SHIELD

## 2018-02-13 ENCOUNTER — Telehealth: Payer: Self-pay

## 2018-02-13 ENCOUNTER — Ambulatory Visit: Payer: Self-pay | Admitting: *Deleted

## 2018-02-13 NOTE — Telephone Encounter (Signed)
Pt reports right arm pain from elbow to wrist. Seen 01/28/18 by Dr. [name]- Jacklynn Lewis, pt states was injected. Reports very little relief from injection.  Has been taking tylenol and Ibuprofen, no relief. States has also tried heat and ice as directed. X-rays negative for fracture.Reports unable to extend arm. States swelling has increased, moderate. Denies any redness, warmth to arm. New onset "Little tingling in finger tips, like when I had carpal tunnel." Appt made for tomorrow [facility]. Care advise given. Instructed to go to ED/UC if pain radiates, redness, warmth occur, fever. Pt verbalizes understanding. Reason for Disposition . Numbness (i.e., loss of sensation) in hand or fingers  Answer Assessment - Initial Assessment Questions 1. ONSET: "When did the pain start?"     01/28/18  Seen by MD, injection 2. LOCATION: "Where is the pain located?"     Right elbow to right wrist 3. PAIN: "How bad is the pain?" (Scale 1-10; or mild, moderate, severe)   - MILD (1-3): doesn't interfere with normal activities   - MODERATE (4-7): interferes with normal activities (e.g., work or school) or awakens from sleep   - SEVERE (8-10): excruciating pain, unable to do any normal activities, unable to hold a cup of water     Constant 10/10 4. WORK OR EXERCISE: "Has there been any recent work or exercise that involved this part of the body?"     no 5. CAUSE: "What do you think is causing the arm pain?"     unsure 6. OTHER SYMPTOMS: "Do you have any other symptoms?" (e.g., neck pain, swelling, rash, fever, numbness, weakness)     Swelling worsening, moderate. No redness or warmth  Protocols used: ARM PAIN-A-AH

## 2018-02-13 NOTE — Telephone Encounter (Signed)
Copied from [place] [id number]. Topic: Appointment Scheduling - Scheduling Inquiry for Clinic >> Feb 13, 2018  1:33 PM Lennox Solders wrote: Reason for CRM: pt is calling and request to see dr [name] only for her wrist and elbow pain.  Called to schedule patient an appointment with Dr. [name] was unable to leave voicemail.

## 2018-02-14 ENCOUNTER — Ambulatory Visit: Payer: BLUE CROSS/BLUE SHIELD | Admitting: Family Medicine

## 2018-02-24 ENCOUNTER — Ambulatory Visit (INDEPENDENT_AMBULATORY_CARE_PROVIDER_SITE_OTHER): Payer: BLUE CROSS/BLUE SHIELD

## 2018-02-24 ENCOUNTER — Encounter: Payer: Self-pay | Admitting: Obstetrics and Gynecology

## 2018-02-24 ENCOUNTER — Ambulatory Visit (INDEPENDENT_AMBULATORY_CARE_PROVIDER_SITE_OTHER): Payer: BLUE CROSS/BLUE SHIELD | Admitting: Obstetrics and Gynecology

## 2018-02-24 VITALS — BP 118/66 | HR 85 | Ht 64.0 in | Wt 205.0 lb

## 2018-02-24 DIAGNOSIS — D251 Intramural leiomyoma of uterus: Secondary | ICD-10-CM

## 2018-02-24 DIAGNOSIS — N939 Abnormal uterine and vaginal bleeding, unspecified: Secondary | ICD-10-CM

## 2018-02-24 NOTE — Progress Notes (Signed)
Gynecology Ultrasound Follow Up  Chief Complaint:  Chief Complaint  Patient presents with  . U/S follow up     History of Present Illness: Patient is a 47 y.o. female who presents today for ultrasound evaluation of abnormal uterine bleeding secondary to uterine fibroidss.  Ultrasound demonstrates the following findgins Adnexa: no masses seen Uterus: Multiple intramural leiomyomata with endometrial stripe  0.9 mm Additional: no free fluid  Review of Systems: Review of Systems  Constitutional: Negative.   Gastrointestinal: Negative.     Past Medical History:  Past Medical History:  Diagnosis Date  . Allergy   . Anxiety   . Arthritis   . Chicken pox   . Depression   . GERD (gastroesophageal reflux disease)   . Headache   . Hyperlipidemia   . Hypertension   . Uterine fibroid   . UTI (urinary tract infection)     Past Surgical History:  Past Surgical History:  Procedure Laterality Date  . CARPAL TUNNEL RELEASE     right 06/2017   . ENDOMETRIAL BIOPSY     neg  . EYE SURGERY Bilateral 2001  . TUBAL LIGATION     1999    Gynecologic History:  No LMP recorded. Contraception: tubal ligation Last Pap: 02/14/2016. Results were: .no abnormalities  Family History:  Family History  Problem Relation Age of Onset  . Healthy Mother   . Arthritis Father   . Hypertension Father   . Healthy Brother   . Arthritis Brother   . Hypertension Brother   . Stroke Brother 30    Social History:  Social History   Socioeconomic History  . Marital status: Single    Spouse name: Not on file  . Number of children: 3  . Years of education: Not on file  . Highest education level: Not on file  Occupational History  . Occupation: [employer]  Social Needs  . Financial resource strain: Not on file  . Food insecurity:    Worry: Not on file    Inability: Not on file  . Transportation needs:    Medical: Not on file    Non-medical: Not on file  Tobacco Use  . Smoking status:  Light Tobacco Smoker    Types: Cigarettes  . Smokeless tobacco: Never Used  Substance and Sexual Activity  . Alcohol use: Yes  . Drug use: No  . Sexual activity: Yes  Lifestyle  . Physical activity:    Days per week: 0 days    Minutes per session: 0 min  . Stress: Very much  Relationships  . Social connections:    Talks on phone: Not on file    Gets together: Not on file    Attends religious service: Not on file    Active member of club or organization: Not on file    Attends meetings of clubs or organizations: Not on file    Relationship status: Not on file  . Intimate partner violence:    Fear of current or ex partner: Not on file    Emotionally abused: Not on file    Physically abused: Not on file    Forced sexual activity: Not on file  Other Topics Concern  . 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  Bee Venom Swelling    Anaphylaxis   . Pollen Extract     Medications: Prior to Admission medications   Medication Sig Start Date End Date Taking? Authorizing Provider  amLODipine (NORVASC) 2.5 MG tablet Take 1 tablet (2.5 mg total) by mouth daily. 01/28/18   McLean-Scocuzza, Nino Glow, MD  clotrimazole (LOTRIMIN) 1 % cream Apply 1 application topically 2 (two) times daily. 01/28/18   McLean-Scocuzza, Nino Glow, MD  clotrimazole-betamethasone (LOTRISONE) cream Apply 1 application topically 2 (two) times daily. Generic ok 01/28/18   McLean-Scocuzza, Nino Glow, MD  hydrocortisone 2.5 % ointment Apply topically 2 (two) times daily. 01/28/18   McLean-Scocuzza, Nino Glow, MD  metroNIDAZOLE (FLAGYL) 500 MG tablet Take 1 tablet (500 mg total) by mouth 2 (two) times daily. 01/28/18   McLean-Scocuzza, Nino Glow, MD  omeprazole (PRILOSEC) 20 MG capsule Take 1 capsule (20 mg total) by mouth daily. On empty stomach  01/28/18   McLean-Scocuzza, Nino Glow, MD  sertraline (ZOLOFT) 100 MG tablet Take 1.5 tablets (150 mg total) by mouth daily. 01/28/18 01/28/19  McLean-Scocuzza, Nino Glow, MD  temazepam (RESTORIL) 15 MG capsule Take 1 capsule (15 mg total) by mouth at bedtime as needed for sleep. 01/28/18   McLean-Scocuzza, Nino Glow, MD    Physical Exam Vitals: Blood pressure 118/66, pulse 85, height 5\' 4"  (1.626 m), weight 205 lb (93 kg).  General: NAD HEENT: normocephalic, anicteric Pulmonary: No increased work of breathing Extremities: no edema, erythema, or tenderness Neurologic: Grossly intact, normal gait Psychiatric: mood appropriate, affect full  Dg Elbow Complete Right  Result Date: 01/28/2018 CLINICAL DATA:  Right elbow pain for several weeks. EXAM: RIGHT ELBOW - COMPLETE 3+ VIEW COMPARISON:  None. FINDINGS: There is no evidence of fracture, dislocation, or joint effusion. There is no evidence of arthropathy or other focal bone abnormality. Soft tissues are unremarkable. IMPRESSION: Normal right elbow. Electronically Signed   By: Marijo Conception, M.D.   On: 01/28/2018 15:32   Dg Wrist Complete Right  Result Date: 01/28/2018 CLINICAL DATA:  Right wrist pain for several weeks. EXAM: RIGHT WRIST - COMPLETE 3+ VIEW COMPARISON:  None. FINDINGS: There is no evidence of fracture or dislocation. There is no evidence of arthropathy or other focal bone abnormality. Soft tissues are unremarkable. IMPRESSION: Normal right wrist. Electronically Signed   By: Marijo Conception, M.D.   On: 01/28/2018 15:34   US Transvaginal Non-ob  Result Date: 02/24/2018 Patient Name: ZHOE CATANIA DOB: 17-Oct-1970 MRN: [id number] ULTRASOUND REPORT Location: [facility] Date of Service: 02/24/2018 Indications:IU fibroids Findings: The uterus is anteverted and measures 12.2x9.76x8.24cm. Echo texture is heterogenous with evidence of focal masses. Within the uterus are multiple suspected fibroids measuring: Fibroid 1:IM, Rt anterior  Uterine wall = 26.19x25.70mm Fibroid 2:IM, Lt  anterior Uterine wall = 24.33x26.40mm Fibroid 3: IM, Rt posterior uterine wall = 18.9x18.23mm Fibroid 4: IM Lt  posterior uterine wall = 12.35x10.31mm Fibroid 5: IM Lt  posterior uterine wall = 28.84x23x75mm The Endometrium measures 9.81 mm. Right Ovary measures is not seen d/t filled bowel Left Ovary measures 2.42x2.39x2.19 cm. It is normal in appearance. Survey of the adnexa demonstrates no adnexal masses. There is no free fluid in the cul de sac. Impression: 1.Within the uterus are multiple suspected fibroids measuring: Fibroid 1:IM, Rt anterior Uterine wall = 26.19x25.39mm Fibroid 2:IM, Lt  anterior Uterine wall = 24.33x26.20mm Fibroid 3: IM, Rt posterior uterine wall = 18.9x18.61mm Fibroid 4: IM Lt  posterior uterine wall = 12.35x10.68mm Fibroid 5: IM Lt  posterior uterine wall = 28.84x23x77mm Recommendations: 1.Clinical correlation with the patient's History and Physical Exam. Abeer Alsammarraie RDMS Images reviewed.  Study notable for multiple intramural fibroids.  Size of fibroids has not increased from prior outside study but the number of fibroids visualized has significantly increased from 2 fibroids to 5 on today's study. Malachy Mood, MD, [facility], [facility] 02/24/2018, 11:58 AM    Assessment: 47 y.o. [id number] follow up AUB-L  Plan: Problem List Items Addressed This Visit      Genitourinary   Fibroid uterus - Primary    Other Visit Diagnoses    Abnormal uterine bleeding          1) Uterine fibroids - increasing in number with continued bleeding.  Was on fibroid [facility] although I do not have these records and its unclear if she was receiving placebo or not.  We once again discussed management options including expectant, medical (including Mirena and leupron), endometrial ablation, and hysterectomy.  Patient opts for definitive surgical management via hysterectomy. The risks of surgery were discussed in  detail with the patient including but not limited to: bleeding which may require transfusion or reoperation; infection which may require antibiotics; injury to bowel, bladder, ureters or other surrounding organs (With a literature reported rate of urinary tract injury of 1% quoted); need for additional procedures including laparotomy; thromboembolic phenomenon, incisional problems and other postoperative/anesthesia complications.  Patient was also advised that recovery procedure generally involves an overnight stay; and the  expected recovery time after a hysterectomy being in the range of 6-8 weeks.  Likelihood of success in alleviating the patient's symptoms was discussed.  While definitive in regards to issues with menstural bleeding, pelvic pain if present preoperatively may continue and in fact worsen postoperatively.  She is aware that the procedure will render her unable to pursue childbearing in the future.   She was told that she will be contacted by our surgical scheduler regarding the time and date of her surgery; routine preoperative instructions of having nothing to eat or drink after midnight on the day prior to surgery and also coming to the hospital 1.5 hours prior to her time of surgery were also emphasized.  She was told she may be called for a preoperative appointment about a week prior to surgery and will be given further preoperative instructions at that visit.  Routine postoperative instructions will be reviewed with the patient and her family in detail after surgery. Printed patient education handouts about the procedure was given to the patient to review at home. - repeat endometrial biopsy prior to hysterectomy  2) A total of 15 minutes were spent in face-to-face contact with the patient during this encounter with over half of that time devoted to counseling and coordination of care.  3) Return if symptoms worsen or fail to improve.    Malachy Mood, MD, [facility],  [facility] 02/24/2018, 9:13 PM

## 2018-02-25 ENCOUNTER — Telehealth: Payer: Self-pay | Admitting: Obstetrics and Gynecology

## 2018-02-25 NOTE — Telephone Encounter (Signed)
-----   Message from Malachy Mood, MD sent at 02/24/2018  9:13 PM EDT ----- Regarding: Surgery Surgery Date:   LOS: observation  Surgery Booking Request Patient Full Name: Stacey Roberson MRN: [id number]  DOB: 10-19-1970  Surgeon: Malachy Mood, MD  Requested Surgery Date and Time: 2nd week of Septermeber Primary Diagnosis and Code: Abnormal uterine bleeding Secondary Diagnosis and Code: Uterine Fibroids Surgical Procedure: TLH/BS and cystoscopy L&D Notification:N/A Admission Status: observation Length of Surgery: 2.5hrs Special Case Needs: none H&P: 1-2 weeks prior (date) Phone Interview or Office Pre-Admit: pre-admit Interpreter: No Language: English Medical Clearance: No Special Scheduling Instructions: Endometrial biopsy at time of H&P

## 2018-02-25 NOTE — Telephone Encounter (Signed)
Patient is aware of H&P at [facility] on 05/08/18 @ 8:50am w/ Dr. Georgianne Fick, Pre-admit Testing afterwards, and OR on 05/19/18. Patient is aware she may receive calls from the [facility] and [facility]. Patient confirmed BCBS, and no secondary insurance. Ext given.

## 2018-03-11 ENCOUNTER — Ambulatory Visit: Payer: BLUE CROSS/BLUE SHIELD | Admitting: Internal Medicine

## 2018-03-26 ENCOUNTER — Ambulatory Visit: Payer: BLUE CROSS/BLUE SHIELD | Admitting: Internal Medicine

## 2018-04-22 ENCOUNTER — Telehealth: Payer: Self-pay

## 2018-04-22 NOTE — Telephone Encounter (Signed)
FMLA/DISABILITY form for [employer] filled out, signature obtained, and given to [place] for processing.

## 2018-05-05 DIAGNOSIS — M7661 Achilles tendinitis, right leg: Secondary | ICD-10-CM | POA: Diagnosis not present

## 2018-05-05 DIAGNOSIS — M7662 Achilles tendinitis, left leg: Secondary | ICD-10-CM | POA: Diagnosis not present

## 2018-05-07 ENCOUNTER — Telehealth: Payer: Self-pay

## 2018-05-07 NOTE — Telephone Encounter (Signed)
Pt wants to know is it okay to use the steroids,  Today before her biopsy tomorrow ?

## 2018-05-07 NOTE — Telephone Encounter (Signed)
Please advise 

## 2018-05-08 ENCOUNTER — Ambulatory Visit (INDEPENDENT_AMBULATORY_CARE_PROVIDER_SITE_OTHER): Payer: BLUE CROSS/BLUE SHIELD | Admitting: Obstetrics and Gynecology

## 2018-05-08 ENCOUNTER — Encounter
Admission: RE | Admit: 2018-05-08 | Discharge: 2018-05-08 | Disposition: A | Discharge status: Home / Self Care | Payer: BLUE CROSS/BLUE SHIELD | Source: Ambulatory Visit | Attending: Obstetrics and Gynecology | Admitting: Obstetrics and Gynecology

## 2018-05-08 ENCOUNTER — Other Ambulatory Visit (HOSPITAL_COMMUNITY)
Admission: RE | Admit: 2018-05-08 | Discharge: 2018-05-08 | Disposition: A | Discharge status: Home / Self Care | Payer: BLUE CROSS/BLUE SHIELD | Source: Ambulatory Visit | Attending: Obstetrics and Gynecology | Admitting: Obstetrics and Gynecology

## 2018-05-08 ENCOUNTER — Other Ambulatory Visit: Payer: Self-pay

## 2018-05-08 ENCOUNTER — Encounter: Payer: Self-pay | Admitting: Obstetrics and Gynecology

## 2018-05-08 VITALS — BP 124/78 | HR 84 | Ht 64.0 in | Wt 189.0 lb

## 2018-05-08 DIAGNOSIS — N858 Other specified noninflammatory disorders of uterus: Secondary | ICD-10-CM | POA: Diagnosis not present

## 2018-05-08 DIAGNOSIS — D250 Submucous leiomyoma of uterus: Secondary | ICD-10-CM | POA: Insufficient documentation

## 2018-05-08 DIAGNOSIS — N939 Abnormal uterine and vaginal bleeding, unspecified: Secondary | ICD-10-CM | POA: Insufficient documentation

## 2018-05-08 DIAGNOSIS — I10 Essential (primary) hypertension: Secondary | ICD-10-CM | POA: Insufficient documentation

## 2018-05-08 DIAGNOSIS — D251 Intramural leiomyoma of uterus: Secondary | ICD-10-CM | POA: Diagnosis not present

## 2018-05-08 DIAGNOSIS — Z01812 Encounter for preprocedural laboratory examination: Secondary | ICD-10-CM | POA: Diagnosis not present

## 2018-05-08 DIAGNOSIS — Z01810 Encounter for preprocedural cardiovascular examination: Secondary | ICD-10-CM | POA: Insufficient documentation

## 2018-05-08 HISTORY — DX: Anemia, unspecified: D64.9

## 2018-05-08 LAB — BASIC METABOLIC PANEL
Anion gap: 5 (ref 5–15)
BUN: 15 mg/dL (ref 6–20)
CO2: 29 mmol/L (ref 22–32)
Calcium: 9.1 mg/dL (ref 8.9–10.3)
Chloride: 105 mmol/L (ref 98–111)
Creatinine, Ser: 0.85 mg/dL (ref 0.44–1.00)
GFR calc Af Amer: >60 mL/min (ref >60)
GFR calc non Af Amer: >60 mL/min (ref >60)
Glucose, Bld: 94 mg/dL (ref 70–99)
Potassium: 3.4 mmol/L — ABNORMAL LOW (ref 3.5–5.1)
Sodium: 139 mmol/L (ref 135–145)

## 2018-05-08 LAB — CBC
HCT: 33.7 % — ABNORMAL LOW (ref 35.0–47.0)
Hemoglobin: 11.2 g/dL — ABNORMAL LOW (ref 12.0–16.0)
MCH: 29.1 pg (ref 26.0–34.0)
MCHC: 33.2 g/dL (ref 32.0–36.0)
MCV: 87.7 fL (ref 80.0–100.0)
Platelets: 280 10*3/uL (ref 150–440)
RBC: 3.84 MIL/uL (ref 3.80–5.20)
RDW: 15.9 % — ABNORMAL HIGH (ref 11.5–14.5)
WBC: 6.1 10*3/uL (ref 3.6–11.0)

## 2018-05-08 LAB — TYPE AND SCREEN
ABO/RH(D): O POS
Antibody Screen: NEGATIVE

## 2018-05-08 NOTE — Addendum Note (Signed)
Addended by: Dorthula Nettles on: 05/08/2018 09:36 AM   Modules accepted: Orders

## 2018-05-08 NOTE — Progress Notes (Signed)
Obstetrics & Gynecology Surgery H&P    Chief Complaint: Scheduled Surgery   History of Present Illness: Patient is a 47 y.o. [id number] presenting for scheduled TLH, BS, cystoscopy, for the treatment or further evaluation of abnormal uterine bleeding - leiomyomata.   Prior Treatments prior to proceeding with surgery include: hormonal management, uterine fibroid study at [facility]  Preoperative Pap: 02/12/2016 Results: no abnormalities  Preoperative Endometrial biopsy: obtained today Preoperative Ultrasound: 02/24/2018 multiple uterine fibroids, EMS of 0.104mm, no free fluid, normal ovaries   Review of Systems:10 point review of systems  Past Medical History:  Past Medical History:  Diagnosis Date  . Allergy   . Anxiety   . Arthritis   . Chicken pox   . Depression   . GERD (gastroesophageal reflux disease)   . Headache   . Hyperlipidemia   . Hypertension   . Uterine fibroid   . UTI (urinary tract infection)     Past Surgical History:  Past Surgical History:  Procedure Laterality Date  . CARPAL TUNNEL RELEASE     right 06/2017   . ENDOMETRIAL BIOPSY     neg  . EYE SURGERY Bilateral 2001  . TUBAL LIGATION     1999    Family History:  Family History  Problem Relation Age of Onset  . Healthy Mother   . Arthritis Father   . Hypertension Father   . Healthy Brother   . Arthritis Brother   . Hypertension Brother   . Stroke Brother 30    Social History:  Social History   Socioeconomic History  . Marital status: Single    Spouse name: Not on file  . Number of children: 3  . Years of education: Not on file  . Highest education level: Not on file  Occupational History  . Occupation: [employer]  Social Needs  . Financial resource strain: Not on file  . Food insecurity:    Worry: Not on file    Inability: Not on file  . Transportation needs:    Medical: Not on file    Non-medical: Not on file  Tobacco Use  . Smoking status: Light Tobacco Smoker    Types: Cigarettes   . Smokeless tobacco: Never Used  Substance and Sexual Activity  . Alcohol use: Yes  . Drug use: No  . Sexual activity: Yes  Lifestyle  . Physical activity:    Days per week: 0 days    Minutes per session: 0 min  . Stress: Very much  Relationships  . Social connections:    Talks on phone: Not on file    Gets together: Not on file    Attends religious service: Not on file    Active member of club or organization: Not on file    Attends meetings of clubs or organizations: Not on file    Relationship status: Not on file  . Intimate partner violence:    Fear of current or ex partner: Not on file    Emotionally abused: Not on file    Physically abused: Not on file    Forced sexual activity: Not on file  Other Topics Concern  . 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 Bee Venom Swelling    Anaphylaxis   .  Pollen Extract     Medications: Prior to Admission medications   Medication Sig Start Date End Date Taking? Authorizing Provider  amLODipine (NORVASC) 2.5 MG tablet Take 1 tablet (2.5 mg total) by mouth daily. Patient not taking: Reported on 05/07/2018 01/28/18   McLean-Scocuzza, Nino Glow, MD  clotrimazole (LOTRIMIN) 1 % cream Apply 1 application topically 2 (two) times daily. 01/28/18   McLean-Scocuzza, Nino Glow, MD  clotrimazole-betamethasone (LOTRISONE) cream Apply 1 application topically 2 (two) times daily. Generic ok Patient not taking: Reported on 05/07/2018 01/28/18   McLean-Scocuzza, Nino Glow, MD  hydrocortisone 2.5 % ointment Apply topically 2 (two) times daily. Patient taking differently: Apply 1 application topically 2 (two) times daily.  01/28/18   McLean-Scocuzza, Nino Glow, MD  methylPREDNISolone (MEDROL) 4 MG tablet Take 4 mg by mouth See admin instructions. Take these numbers of tablets on  consecutive ATFT:7-3-2-2-0-2    [provider]  omeprazole (PRILOSEC) 20 MG capsule Take 1 capsule (20 mg total) by mouth daily. On empty stomach 01/28/18   McLean-Scocuzza, Nino Glow, MD  sertraline (ZOLOFT) 100 MG tablet Take 1.5 tablets (150 mg total) by mouth daily. Patient taking differently: Take 150 mg by mouth at bedtime.  01/28/18 01/28/19  McLean-Scocuzza, Nino Glow, MD  temazepam (RESTORIL) 15 MG capsule Take 1 capsule (15 mg total) by mouth at bedtime as needed for sleep. Patient not taking: Reported on 05/07/2018 01/28/18   McLean-Scocuzza, Nino Glow, MD    Physical Exam Vitals: Blood pressure 124/78, pulse 84, height 5\' 4"  (1.626 m), weight 189 lb (85.7 kg), last menstrual period 03/08/2018. General: NAD HEENT: normocephalic, anicteric Pulmonary: No increased work of breathing Cardiovascular: RRR, distal pulses 2+ Abdomen: soft, non-tender, non-distended Genitourinary: Normal external female genitalia, multiparous cervix, uterus irregular contour and enlarged Extremities: no edema, erythema, or tenderness Neurologic: Grossly intact Psychiatric: mood appropriate, affect full   ENDOMETRIAL BIOPSY     The indications for endometrial biopsy were reviewed.   Risks of the biopsy including cramping, bleeding, infection, uterine perforation, inadequate specimen and need for additional procedures  were discussed. The patient states she understands and agrees to undergo procedure today. Consent was signed. Time out was performed. Urine HCG was negative. A Graves speculum was placed and the cervix was brought into view.  The cervix was prepped with Betadine. A single-toothed tenaculum was  placed on the anterior lip of the cervix for traction. A 3 mm pipelle was introduced through the cervix into the endometrial cavity without difficulty to a depth of 10cm, and a moderate amount of tissue was obtained, the resulting specime sent to pathology. The instruments were removed from the patient's  vagina. Minimal bleeding from the cervix was noted. The patient tolerated the procedure well. Routine post-procedure instructions were given to the patient.  She will be contacted by phone one results become available.     Imaging No results found.  Assessment: 47 y.o. [id number] presenting for scheduled TLH, BS, cystoscopy  Plan: 1) Patient opts for definitive surgical management via hysterectomy. The risks of surgery were discussed in detail with the patient including but not limited to: bleeding which may require transfusion or reoperation; infection which may require antibiotics; injury to bowel, bladder, ureters or other surrounding organs (With a literature reported rate of urinary tract injury of 1% quoted); need for additional procedures including laparotomy; thromboembolic phenomenon, incisional problems and other postoperative/anesthesia complications.  Patient was also advised that recovery procedure generally involves an overnight stay; and the  expected recovery time after  a hysterectomy being in the range of 6-8 weeks.  Likelihood of success in alleviating the patient's symptoms was discussed.  While definitive in regards to issues with menstural bleeding, pelvic pain if present preoperatively may continue and in fact worsen postoperatively.  She is aware that the procedure will render her unable to pursue childbearing in the future.   She was told that she will be contacted by our surgical scheduler regarding the time and date of her surgery; routine preoperative instructions of having nothing to eat or drink after midnight on the day prior to surgery and also coming to the hospital 1.5 hours prior to her time of surgery were also emphasized.  She was told she may be called for a preoperative appointment about a week prior to surgery and will be given further preoperative instructions at that visit.  Routine postoperative instructions will be reviewed with the patient and her family in detail  after surgery. Printed patient education handouts about the procedure was given to the patient to review at home.   2) Routine postoperative instructions were reviewed with the patient and her family in detail today including the expected length of recovery and likely postoperative course.  The patient concurred with the proposed plan, giving informed written consent for the surgery today.  Patient instructed on the importance of being NPO after midnight prior to her procedure.  If warranted preoperative prophylactic antibiotics and SCDs ordered on call to the OR to meet SCIP guidelines and adhere to recommendation laid forth in [place] Number 104 May 2009  "Antibiotic Prophylaxis for Gynecologic Procedures".     Malachy Mood, MD, [facility], [facility] 05/08/2018, 9:30 AM

## 2018-05-08 NOTE — Patient Instructions (Addendum)
  Your procedure is scheduled on: Tuesday May 19, 2018 Report to Same Day Surgery 2nd floor medical mall (Agenda Entrance-take elevator on left to 2nd floor.  Check in with surgery information desk.) To find out your arrival time please call [phone] between 1PM - 3PM on Monday May 18, 2018  Remember: Instructions that are not followed completely may result in serious medical risk, up to and including death, or upon the discretion of your surgeon and anesthesiologist your surgery may need to be rescheduled.    _x___ 1. Do not eat food (including mints, candies, chewing gum) after midnight the night before your procedure. You may drink clear liquids up to 2 hours before you are scheduled to arrive at the hospital for your procedure.  Do not drink clear liquids within 2 hours of your scheduled arrival to the hospital.  Clear liquids include  --Water or Apple juice without pulp  --Clear carbohydrate beverage such as Gatorade  --Black Coffee or Clear Tea (No milk, no creamers, do not add anything to the coffee or tea)    __x__ 2. No Alcohol for 24 hours before or after surgery.   __x__ 3. No Smoking or e-cigarettes for 24 prior to surgery.  Do not use any chewable tobacco products for at least 6 hour prior to surgery   __x__ 4. Notify your doctor if there is any change in your medical condition (cold, fever, infections).   __x__ 5. On the morning of surgery brush your teeth with toothpaste and water.  You may rinse your mouth with mouth wash if you wish.  Do not swallow any toothpaste or mouthwash.  Please read over the following fact sheets that you were given:   [facility] Preparing for Surgery and or MRSA Information    __x__ Use CHG Soap or sage wipes as directed on instruction sheet    Do not wear jewelry, make-up, hairpins, clips or nail polish.  Do not wear lotions, powders, deodorant, or perfumes.   Do not shave below the face/neck 48 hours prior to surgery.    Do not bring valuables to the hospital.    [facility] is not responsible for any belongings or valuables.               Contacts, dentures or bridgework may not be worn into surgery.  Leave your suitcase in the car. After surgery it may be brought to your room.  For patients admitted to the hospital, discharge time is determined by your treatment team.  For patients discharged on the day of surgery, you will NOT be permitted to drive yourself home.   _x___ Take anti-hypertensive listed below, cardiac, seizure, asthma, anti-reflux and psychiatric medicines. These include:  1. Omeprazole/Prilosec  _x___ Follow recommendations from Cardiologist, Pulmonologist or PCP regarding stopping Aspirin, Coumadin, Plavix ,Eliquis, Effient, or Pradaxa, and Pletal.  _x___ Stop Anti-inflammatories such as Advil, Aleve, Ibuprofen, Motrin, Naproxen, Naprosyn, Goodies powders or aspirin products. OK to take Tylenol and Celebrex.   _x___ NOW: Stop supplements until after surgery.  But may continue Vitamin D, Vitamin B, and multivitamin.

## 2018-05-18 MED ORDER — CEFAZOLIN SODIUM-DEXTROSE 2-4 GM/100ML-% IV SOLN
2.0000 g | INTRAVENOUS | Status: AC
  Administered 2018-05-19: 2 g via INTRAVENOUS

## 2018-05-19 ENCOUNTER — Other Ambulatory Visit: Payer: Self-pay

## 2018-05-19 ENCOUNTER — Ambulatory Visit: Payer: BLUE CROSS/BLUE SHIELD | Admitting: Anesthesiology

## 2018-05-19 ENCOUNTER — Encounter: Payer: Self-pay | Admitting: *Deleted

## 2018-05-19 ENCOUNTER — Encounter
Admission: RE | Disposition: A | Discharge status: Home / Self Care | Payer: Self-pay | Source: Ambulatory Visit | Attending: Obstetrics and Gynecology

## 2018-05-19 ENCOUNTER — Observation Stay
Admission: RE | Admit: 2018-05-19 | Discharge: 2018-05-20 | Disposition: A | Discharge status: Home / Self Care | Payer: BLUE CROSS/BLUE SHIELD | Source: Ambulatory Visit | Attending: Obstetrics and Gynecology | Admitting: Obstetrics and Gynecology

## 2018-05-19 DIAGNOSIS — N920 Excessive and frequent menstruation with regular cycle: Secondary | ICD-10-CM | POA: Diagnosis not present

## 2018-05-19 DIAGNOSIS — N800 Endometriosis of uterus: Secondary | ICD-10-CM | POA: Diagnosis not present

## 2018-05-19 DIAGNOSIS — K219 Gastro-esophageal reflux disease without esophagitis: Secondary | ICD-10-CM | POA: Diagnosis not present

## 2018-05-19 DIAGNOSIS — Z79899 Other long term (current) drug therapy: Secondary | ICD-10-CM | POA: Diagnosis not present

## 2018-05-19 DIAGNOSIS — E785 Hyperlipidemia, unspecified: Secondary | ICD-10-CM | POA: Insufficient documentation

## 2018-05-19 DIAGNOSIS — D252 Subserosal leiomyoma of uterus: Secondary | ICD-10-CM | POA: Diagnosis not present

## 2018-05-19 DIAGNOSIS — F329 Major depressive disorder, single episode, unspecified: Secondary | ICD-10-CM | POA: Insufficient documentation

## 2018-05-19 DIAGNOSIS — D251 Intramural leiomyoma of uterus: Secondary | ICD-10-CM | POA: Insufficient documentation

## 2018-05-19 DIAGNOSIS — Z87440 Personal history of urinary (tract) infections: Secondary | ICD-10-CM | POA: Diagnosis not present

## 2018-05-19 DIAGNOSIS — D649 Anemia, unspecified: Secondary | ICD-10-CM | POA: Diagnosis not present

## 2018-05-19 DIAGNOSIS — I10 Essential (primary) hypertension: Secondary | ICD-10-CM | POA: Insufficient documentation

## 2018-05-19 DIAGNOSIS — N802 Endometriosis of fallopian tube: Secondary | ICD-10-CM | POA: Diagnosis not present

## 2018-05-19 DIAGNOSIS — N939 Abnormal uterine and vaginal bleeding, unspecified: Secondary | ICD-10-CM | POA: Insufficient documentation

## 2018-05-19 DIAGNOSIS — D259 Leiomyoma of uterus, unspecified: Secondary | ICD-10-CM | POA: Diagnosis not present

## 2018-05-19 DIAGNOSIS — Z90710 Acquired absence of both cervix and uterus: Secondary | ICD-10-CM | POA: Diagnosis present

## 2018-05-19 HISTORY — PX: LAPAROSCOPIC HYSTERECTOMY: SHX1926

## 2018-05-19 HISTORY — PX: CYSTOSCOPY: SHX5120

## 2018-05-19 LAB — POCT PREGNANCY, URINE: Preg Test, Ur: NEGATIVE

## 2018-05-19 LAB — ABO/RH: ABO/RH(D): O POS

## 2018-05-19 SURGERY — HYSTERECTOMY, TOTAL, LAPAROSCOPIC
Anesthesia: General

## 2018-05-19 MED ORDER — PROPOFOL 10 MG/ML IV BOLUS
INTRAVENOUS | Status: DC | PRN
  Administered 2018-05-19: 150 mg via INTRAVENOUS

## 2018-05-19 MED ORDER — FAMOTIDINE 20 MG PO TABS
ORAL_TABLET | ORAL | Status: AC
  Filled 2018-05-19: qty 1

## 2018-05-19 MED ORDER — KETOROLAC TROMETHAMINE 30 MG/ML IJ SOLN
30.0000 mg | Freq: Four times a day (QID) | INTRAMUSCULAR | Status: DC
  Administered 2018-05-19 – 2018-05-20 (×4): 30 mg via INTRAVENOUS
  Filled 2018-05-19 (×4): qty 1

## 2018-05-19 MED ORDER — PHENYLEPHRINE HCL 10 MG/ML IJ SOLN
INTRAMUSCULAR | Status: DC | PRN
  Administered 2018-05-19: 200 ug via INTRAVENOUS

## 2018-05-19 MED ORDER — BUPIVACAINE HCL 0.5 % IJ SOLN
INTRAMUSCULAR | Status: DC | PRN
  Administered 2018-05-19: 20 mL

## 2018-05-19 MED ORDER — SUGAMMADEX SODIUM 200 MG/2ML IV SOLN
INTRAVENOUS | Status: AC
  Filled 2018-05-19: qty 2

## 2018-05-19 MED ORDER — MENTHOL 3 MG MT LOZG
1.0000 | LOZENGE | OROMUCOSAL | Status: DC | PRN
  Filled 2018-05-19 (×2): qty 9

## 2018-05-19 MED ORDER — LIDOCAINE HCL (PF) 2 % IJ SOLN
INTRAMUSCULAR | Status: AC
  Filled 2018-05-19: qty 10

## 2018-05-19 MED ORDER — DEXAMETHASONE SODIUM PHOSPHATE 10 MG/ML IJ SOLN
INTRAMUSCULAR | Status: DC | PRN
  Administered 2018-05-19: 5 mg via INTRAVENOUS

## 2018-05-19 MED ORDER — MORPHINE SULFATE (PF) 2 MG/ML IV SOLN
1.0000 mg | INTRAVENOUS | Status: DC | PRN
  Administered 2018-05-19: 2 mg via INTRAVENOUS
  Filled 2018-05-19: qty 1

## 2018-05-19 MED ORDER — DEXTROSE-NACL 5-0.45 % IV SOLN
INTRAVENOUS | Status: DC
  Administered 2018-05-19 – 2018-05-20 (×3): via INTRAVENOUS

## 2018-05-19 MED ORDER — FENTANYL CITRATE (PF) 100 MCG/2ML IJ SOLN: 25.0000 ug | INTRAMUSCULAR | Status: DC | PRN

## 2018-05-19 MED ORDER — CEFAZOLIN SODIUM-DEXTROSE 2-4 GM/100ML-% IV SOLN
INTRAVENOUS | Status: AC
  Filled 2018-05-19: qty 100

## 2018-05-19 MED ORDER — MIDAZOLAM HCL 2 MG/2ML IJ SOLN
INTRAMUSCULAR | Status: DC | PRN
  Administered 2018-05-19: 2 mg via INTRAVENOUS

## 2018-05-19 MED ORDER — SUCCINYLCHOLINE CHLORIDE 20 MG/ML IJ SOLN
INTRAMUSCULAR | Status: DC | PRN
  Administered 2018-05-19: 100 mg via INTRAVENOUS

## 2018-05-19 MED ORDER — PROPOFOL 10 MG/ML IV BOLUS
INTRAVENOUS | Status: AC
  Filled 2018-05-19: qty 20

## 2018-05-19 MED ORDER — ONDANSETRON HCL 4 MG/2ML IJ SOLN
INTRAMUSCULAR | Status: DC | PRN
  Administered 2018-05-19: 4 mg via INTRAVENOUS

## 2018-05-19 MED ORDER — OXYCODONE HCL 5 MG PO TABS: 5.0000 mg | ORAL_TABLET | Freq: Once | ORAL | Status: DC | PRN

## 2018-05-19 MED ORDER — OXYCODONE HCL 5 MG/5ML PO SOLN: 5.0000 mg | Freq: Once | ORAL | Status: DC | PRN

## 2018-05-19 MED ORDER — LIDOCAINE HCL (CARDIAC) PF 100 MG/5ML IV SOSY
PREFILLED_SYRINGE | INTRAVENOUS | Status: DC | PRN
  Administered 2018-05-19: 60 mg via INTRAVENOUS

## 2018-05-19 MED ORDER — ROCURONIUM BROMIDE 50 MG/5ML IV SOLN
INTRAVENOUS | Status: AC
  Filled 2018-05-19: qty 1

## 2018-05-19 MED ORDER — FENTANYL CITRATE (PF) 100 MCG/2ML IJ SOLN
INTRAMUSCULAR | Status: AC
  Filled 2018-05-19: qty 2

## 2018-05-19 MED ORDER — LACTATED RINGERS IV SOLN
INTRAVENOUS | Status: DC
  Administered 2018-05-19: 07:00:00 via INTRAVENOUS

## 2018-05-19 MED ORDER — PROMETHAZINE HCL 25 MG/ML IJ SOLN: 6.2500 mg | INTRAMUSCULAR | Status: DC | PRN

## 2018-05-19 MED ORDER — KETOROLAC TROMETHAMINE 30 MG/ML IJ SOLN: 30.0000 mg | Freq: Four times a day (QID) | INTRAMUSCULAR | Status: DC

## 2018-05-19 MED ORDER — ONDANSETRON HCL 4 MG/2ML IJ SOLN
INTRAMUSCULAR | Status: AC
  Filled 2018-05-19: qty 2

## 2018-05-19 MED ORDER — ROCURONIUM BROMIDE 100 MG/10ML IV SOLN
INTRAVENOUS | Status: DC | PRN
  Administered 2018-05-19: 45 mg via INTRAVENOUS
  Administered 2018-05-19: 5 mg via INTRAVENOUS
  Administered 2018-05-19: 10 mg via INTRAVENOUS
  Administered 2018-05-19: 20 mg via INTRAVENOUS

## 2018-05-19 MED ORDER — MIDAZOLAM HCL 2 MG/2ML IJ SOLN
INTRAMUSCULAR | Status: AC
  Filled 2018-05-19: qty 2

## 2018-05-19 MED ORDER — SERTRALINE HCL 100 MG PO TABS
150.0000 mg | ORAL_TABLET | Freq: Every day | ORAL | Status: DC
  Administered 2018-05-19: 150 mg via ORAL
  Filled 2018-05-19: qty 2

## 2018-05-19 MED ORDER — OXYCODONE-ACETAMINOPHEN 5-325 MG PO TABS
1.0000 | ORAL_TABLET | ORAL | Status: DC | PRN
  Administered 2018-05-19 (×2): 1 via ORAL
  Administered 2018-05-20 (×4): 2 via ORAL
  Filled 2018-05-19 (×3): qty 2
  Filled 2018-05-19 (×2): qty 1
  Filled 2018-05-19: qty 2

## 2018-05-19 MED ORDER — BUPIVACAINE HCL (PF) 0.5 % IJ SOLN
INTRAMUSCULAR | Status: AC
  Filled 2018-05-19: qty 30

## 2018-05-19 MED ORDER — SUGAMMADEX SODIUM 200 MG/2ML IV SOLN
INTRAVENOUS | Status: DC | PRN
  Administered 2018-05-19: 170 mg via INTRAVENOUS

## 2018-05-19 MED ORDER — FENTANYL CITRATE (PF) 100 MCG/2ML IJ SOLN
INTRAMUSCULAR | Status: DC | PRN
  Administered 2018-05-19 (×2): 50 ug via INTRAVENOUS
  Administered 2018-05-19: 100 ug via INTRAVENOUS

## 2018-05-19 MED ORDER — DEXAMETHASONE SODIUM PHOSPHATE 10 MG/ML IJ SOLN
INTRAMUSCULAR | Status: AC
  Filled 2018-05-19: qty 1

## 2018-05-19 MED ORDER — FAMOTIDINE 20 MG PO TABS
20.0000 mg | ORAL_TABLET | Freq: Once | ORAL | Status: AC
  Administered 2018-05-19: 20 mg via ORAL

## 2018-05-19 MED ORDER — ONDANSETRON HCL 4 MG/2ML IJ SOLN: 4.0000 mg | Freq: Four times a day (QID) | INTRAMUSCULAR | Status: DC | PRN

## 2018-05-19 SURGICAL SUPPLY — 52 items
BAG URINE DRAINAGE (UROLOGICAL SUPPLIES) ×3 IMPLANT
BLADE SURG SZ10 CARB STEEL (BLADE) ×9 IMPLANT
BLADE SURG SZ11 CARB STEEL (BLADE) ×3 IMPLANT
CANISTER SUCT 1200ML W/VALVE (MISCELLANEOUS) ×3 IMPLANT
CATH FOLEY 2WAY  5CC 16FR (CATHETERS) ×1
CATH URTH 16FR FL 2W BLN LF (CATHETERS) ×2 IMPLANT
CHLORAPREP W/TINT 26ML (MISCELLANEOUS) ×3 IMPLANT
COUNTER NEEDLE 20/40 LG (NEEDLE) ×3 IMPLANT
DEFOGGER SCOPE WARMER CLEARIFY (MISCELLANEOUS) ×3 IMPLANT
DERMABOND ADVANCED (GAUZE/BANDAGES/DRESSINGS) ×1
DERMABOND ADVANCED .7 DNX12 (GAUZE/BANDAGES/DRESSINGS) ×2 IMPLANT
GLOVE BIO SURGEON STRL SZ7 (GLOVE) ×12 IMPLANT
GLOVE INDICATOR 7.5 STRL GRN (GLOVE) ×3 IMPLANT
GOWN STRL REUS W/ TWL LRG LVL3 (GOWN DISPOSABLE) ×4 IMPLANT
GOWN STRL REUS W/ TWL XL LVL3 (GOWN DISPOSABLE) ×2 IMPLANT
GOWN STRL REUS W/TWL LRG LVL3 (GOWN DISPOSABLE) ×2
GOWN STRL REUS W/TWL XL LVL3 (GOWN DISPOSABLE) ×1
IRRIGATION STRYKERFLOW (MISCELLANEOUS) ×2 IMPLANT
IRRIGATOR STRYKERFLOW (MISCELLANEOUS) ×3
IV LACTATED RINGERS 1000ML (IV SOLUTION) ×3 IMPLANT
IV NS 1000ML (IV SOLUTION) ×1
IV NS 1000ML BAXH (IV SOLUTION) ×2 IMPLANT
KIT PINK PAD W/HEAD ARE REST (MISCELLANEOUS) ×3
KIT PINK PAD W/HEAD ARM REST (MISCELLANEOUS) ×2 IMPLANT
LABEL OR SOLS (LABEL) ×3 IMPLANT
MANIPULATOR VCARE LG CRV RETR (MISCELLANEOUS) ×3 IMPLANT
MANIPULATOR VCARE SML CRV RETR (MISCELLANEOUS) IMPLANT
MANIPULATOR VCARE STD CRV RETR (MISCELLANEOUS) IMPLANT
NS IRRIG 1000ML POUR BTL (IV SOLUTION) ×3 IMPLANT
NS IRRIG 500ML POUR BTL (IV SOLUTION) ×3 IMPLANT
OCCLUDER COLPOPNEUMO (BALLOONS) ×3 IMPLANT
PACK GYN LAPAROSCOPIC (MISCELLANEOUS) ×3 IMPLANT
PAD OB MATERNITY 4.3X12.25 (PERSONAL CARE ITEMS) ×3 IMPLANT
PAD PREP 24X41 OB/GYN DISP (PERSONAL CARE ITEMS) ×3 IMPLANT
SET CYSTO W/LG BORE CLAMP LF (SET/KITS/TRAYS/PACK) ×3 IMPLANT
SHEARS HARMONIC ACE PLUS 36CM (ENDOMECHANICALS) ×3 IMPLANT
SLEEVE ENDOPATH XCEL 5M (ENDOMECHANICALS) ×6 IMPLANT
SPONGE LAP 18X18 RF (DISPOSABLE) IMPLANT
SPONGE XRAY 4X4 16PLY STRL (MISCELLANEOUS) IMPLANT
STRAP SAFETY 5IN WIDE (MISCELLANEOUS) ×3 IMPLANT
SURGILUBE 2OZ TUBE FLIPTOP (MISCELLANEOUS) ×3 IMPLANT
SUT MAXON ABS #0 GS21 30IN (SUTURE) ×9 IMPLANT
SUT MNCRL 4-0 (SUTURE) ×1
SUT MNCRL 4-0 27XMFL (SUTURE) ×2
SUT VIC AB 0 CT1 27 (SUTURE) ×1
SUT VIC AB 0 CT1 27XCR 8 STRN (SUTURE) ×2 IMPLANT
SUT VIC AB 2-0 UR6 27 (SUTURE) IMPLANT
SUTURE MNCRL 4-0 27XMF (SUTURE) ×2 IMPLANT
SYR 10ML LL (SYRINGE) ×3 IMPLANT
SYR 50ML LL SCALE MARK (SYRINGE) ×3 IMPLANT
TROCAR XCEL NON-BLD 5MMX100MML (ENDOMECHANICALS) ×3 IMPLANT
TUBING INSUF HEATED (TUBING) ×3 IMPLANT

## 2018-05-19 NOTE — Anesthesia Post-op Follow-up Note (Signed)
Anesthesia QCDR form completed.        

## 2018-05-19 NOTE — Anesthesia Procedure Notes (Signed)
Procedure Name: Intubation Date/Time: 05/19/2018 7:47 AM Performed by: Jonna Clark, CRNA Pre-anesthesia Checklist: Patient identified, Patient being monitored, Timeout performed, Emergency Drugs available and Suction available Patient Re-evaluated:Patient Re-evaluated prior to induction Oxygen Delivery Method: Circle system utilized Preoxygenation: Pre-oxygenation with 100% oxygen Induction Type: IV induction, Rapid sequence and Cricoid Pressure applied Ventilation: Mask ventilation without difficulty Laryngoscope Size: Mac and 3 Grade View: Grade I Tube type: Oral Tube size: 7.0 mm Number of attempts: 1 Placement Confirmation: ETT inserted through vocal cords under direct vision,  positive ETCO2 and breath sounds checked- equal and bilateral Secured at: 21 cm Tube secured with: Tape Dental Injury: Teeth and Oropharynx as per pre-operative assessment

## 2018-05-19 NOTE — Anesthesia Postprocedure Evaluation (Signed)
Anesthesia Post Note  Patient: Stacey Roberson  Procedure(s) Performed: HYSTERECTOMY TOTAL LAPAROSCOPIC, BILATERAL SALPINGECTOMY (Bilateral ) CYSTOSCOPY (N/A )  Patient location during evaluation: PACU Anesthesia Type: General Level of consciousness: awake and alert Pain management: pain level controlled Vital Signs Assessment: post-procedure vital signs reviewed and stable Respiratory status: spontaneous breathing, nonlabored ventilation, respiratory function stable and patient connected to nasal cannula oxygen Cardiovascular status: blood pressure returned to baseline and stable Postop Assessment: no apparent nausea or vomiting Anesthetic complications: no     Last Vitals:  Vitals:   05/19/18 1258 05/19/18 1300  BP: (P) 118/66 118/66  Pulse: (P) 78 78  Resp:  18  Temp:  37.2 C  SpO2:  97%    Last Pain:  Vitals:   05/19/18 1330  TempSrc:   PainSc: 10-Worst pain ever                 Durenda Hurt

## 2018-05-19 NOTE — Progress Notes (Signed)
15 minute to floor.

## 2018-05-19 NOTE — Anesthesia Preprocedure Evaluation (Signed)
Anesthesia Evaluation  Patient identified by MRN, date of birth, ID band Patient awake    Reviewed: Allergy & Precautions, H&P , NPO status , Patient's Chart, lab work & pertinent test results  Airway Mallampati: I  TM Distance: >3 FB Neck ROM: full    Dental no notable dental hx. (+) Teeth Intact   Pulmonary neg pulmonary ROS, neg COPD,    breath sounds clear to auscultation       Cardiovascular hypertension, + Peripheral Vascular Disease  (-) CAD, (-) Past MI, (-) Cardiac Stents and (-) CABG negative cardio ROS  (-) dysrhythmias  Rhythm:regular Rate:Normal     Neuro/Psych  Headaches, negative neurological ROS  negative psych ROS   GI/Hepatic negative GI ROS, Neg liver ROS, GERD  Poorly Controlled,  Endo/Other  negative endocrine ROS  Renal/GU      Musculoskeletal   Abdominal   Peds  Hematology negative hematology ROS (+) anemia ,   Anesthesia Other Findings Past Medical History: No date: Allergy No date: Anemia No date: Anxiety No date: Arthritis No date: Chicken pox No date: Depression No date: GERD (gastroesophageal reflux disease) No date: Headache No date: Hyperlipidemia No date: Hypertension No date: Uterine fibroid No date: UTI (urinary tract infection)  Past Surgical History: No date: CARPAL TUNNEL RELEASE     Comment:  right 06/2017  No date: ENDOMETRIAL BIOPSY     Comment:  neg 2001: EYE SURGERY; Bilateral No date: TUBAL LIGATION     Comment:  1999  BMI    Body Mass Index:  32.43 kg/m      Reproductive/Obstetrics negative OB ROS                             Anesthesia Physical Anesthesia Plan  ASA: II  Anesthesia Plan: General ETT, Rapid Sequence and Cricoid Pressure   Post-op Pain Management:    Induction:   PONV Risk Score and Plan: Ondansetron, Dexamethasone and Midazolam  Airway Management Planned:   Additional Equipment:   Intra-op Plan:    Post-operative Plan:   Informed Consent: I have reviewed the patients History and Physical, chart, labs and discussed the procedure including the risks, benefits and alternatives for the proposed anesthesia with the patient or authorized representative who has indicated his/her understanding and acceptance.   Dental Advisory Given  Plan Discussed with: Anesthesiologist, CRNA and Surgeon  Anesthesia Plan Comments:         Anesthesia Quick Evaluation

## 2018-05-19 NOTE — Transfer of Care (Signed)
Immediate Anesthesia Transfer of Care Note  Patient: Stacey Roberson  Procedure(s) Performed: HYSTERECTOMY TOTAL LAPAROSCOPIC, BILATERAL SALPINGECTOMY (Bilateral ) CYSTOSCOPY (N/A )  Patient Location: PACU  Anesthesia Type:General  Level of Consciousness: drowsy and patient cooperative  Airway & Oxygen Therapy: Patient Spontanous Breathing and Patient connected to face mask oxygen  Post-op Assessment: Report given to RN and Post -op Vital signs reviewed and stable  Post vital signs: Reviewed and stable  Last Vitals:  Vitals Value Taken Time  BP 119/67 05/19/2018 10:53 AM  Temp 36.4 C 05/19/2018 10:53 AM  Pulse 93 05/19/2018 10:55 AM  Resp 0 05/19/2018 10:55 AM  SpO2 100 % 05/19/2018 10:55 AM  Vitals shown include unvalidated device data.  Last Pain:  Vitals:   05/19/18 0621  TempSrc: Temporal  PainSc: 0-No pain         Complications: No apparent anesthesia complications

## 2018-05-19 NOTE — H&P (Signed)
Date of Initial H&P: 05/08/18  History reviewed, patient examined, no change in status, stable for surgery.

## 2018-05-19 NOTE — Progress Notes (Signed)
   Subjective: Patient reports  incisional pain.  Pain is well-controlled on current  analgesic regimen..  Tolerating po: Yes   Objective: Vital signs in last 24 hours: Temp:  [97.4 F (36.3 C)-98.9 F (37.2 C)] 98.7 F (37.1 C) (09/10 1516) Pulse Rate:  [78-96] 85 (09/10 1516) Resp:  [13-20] 18 (09/10 1516) BP: (114-155)/(58-95) 117/58 (09/10 1516) SpO2:  [97 %-100 %] 98 % (09/10 1516) Weight:  [85.7 kg] 85.7 kg (09/10 0621)    Intake/Output from previous day: No intake/output data recorded.  Physical Examination: Physical Exam  Constitutional: She appears well-developed and well-nourished. No distress.  HENT:  Head: Normocephalic.  Cardiovascular: Normal rate.  Pulmonary/Chest: No respiratory distress.  Abdominal: Soft. Bowel sounds are normal. She exhibits no distension. There is tenderness. There is no guarding.  Skin: She is not diaphoretic.  Incisions D/C/I  Labs: Results for orders placed or performed during the hospital encounter of 05/19/18 (from the past 72 hour(s))  Pregnancy, urine POC     Status: None   Collection Time: 05/19/18  6:19 AM  Result Value Ref Range   Preg Test, Ur NEGATIVE NEGATIVE    Comment:        THE SENSITIVITY OF THIS METHODOLOGY IS >24 mIU/mL   ABO/Rh     Status: None   Collection Time: 05/19/18  6:35 AM  Result Value Ref Range   ABO/RH(D)      O POS Performed at [facility], [address] [place], [place] [address]      Assessment:  47 y.o. s/p Day of Surgery Procedure(s) (LRB): HYSTERECTOMY TOTAL LAPAROSCOPIC, BILATERAL SALPINGECTOMY (Bilateral) CYSTOSCOPY (N/A) : stable  Plan: 1) Pain: well controlled on current analgesic regimen  2) Heme: hemodynamically stable 178mL of EBL, repeat CBC in AM  3) FEN: Advance diet  4) Prophylaxis: intermittent pneumatic compression boots.  5) Disposition: anticipate discharge tomorrow  Malachy Mood, MD, [place], [facility] 05/19/2018,  4:54 PM

## 2018-05-19 NOTE — Op Note (Signed)
Preoperative Diagnosis: 1) 47 y.o. with fibroid uterus 2) Menorrhagia 3) Anemia  Postoperative Diagnosis: 1) 47 y.o. with fibroid uterus >250g 2) Menorrhagia 3) Anemia  Operation Performed: Total laparoscopic hysterectomy, bilateral salpingectomy, and cystoscopy  Indication: Fibroid uterus with menorrhagia failing conservative management  Surgeon: Malachy Mood, MD  Assistant: Adrian Prows, MD assisted in this case secondary to need for high level surgical assistant with no other assistance available.  Anesthesia: General  Preoperative Antibiotics: 2g Ancef  Estimated Blood Loss: 100 mL  IV Fluids: 787mL  Urine Output:: 616mL  Drains or Tubes: none  Implants: none  Specimens Removed: Uterus, cervix, and bilateral fallopian tubes  Complications: none  Intraoperative Findings: 16 week size fibroid uterus with several intramural myomas noted.  Tubes status post prior partial salpingectomy, normal ovaries.  Cystoscopy at the end of the case revealed an intact bladder dome and bilateral efflux of urine from both ureteral orifices.   Patient Condition: stable  Procedure in Detail:  Patient was taken to the operating room where she was administered general anesthesia.  She was positioned in the dorsal lithotomy position utilizing Allen stirups, prepped and draped in the usual sterile fashion.  Prior to proceeding with procedure a time out was performed.  Attention was turned to the patient's pelvis.  An indwelling foley catheter was placed to decompress the patient's bladder.  An operative speculum was placed to allow visualization of the cervix.  The anterior lip of the cervix was grasped with a single tooth tenaculum, and a large V-care uterine manipulator was placed to allow manipulation of the uterus.  The operative speculum and single tooth tenaculum were then removed.  Attention was turned to the patient's abdomen.  The umbilicus was infiltrated with 1% Sensorcaine,  before making a stab incision using an 11 blade scalpel.  A 46mm Excel trocar was then used to gain direct entry into the peritoneal cavity utilizing the camera to visualize progress of the trocar during placement.  Once peritoneal entry had been achieved, insufflation was started and pneumoperitoneum established at a pressure of 67mmHg.    One left and one right lower quadrant site were then injected with 1% Sensorcaine and a stab incision was made using an 11 blade scalpel.  Two additional 54mm Excel trocars were placed through these incisions under direct visualization. General inspection of the abdomen revealed the above noted findings.   The right tube was identified and grasped at its fimbriated end.  The tube was transected from its attachments to the ovary and mesosalpinx using a 65mm Harmonic scalpel.  The utero ovarian ligament was identified ligated and transected using the Harmonic scalpel. The round ligament was then likewise ligated and transected.  The anterior leaf of the broad ligament was dissected down to the level of the internal cervical os and a bladder flap was started.  The posterior leaf of the broad ligament was dissected down to the utero-sacral ligament.  The uterine artery was skeletonized before being ligated and transected using the Harmonic scalpel with cephelad pressure applied to the V-care device to assure lateralization of the ureter.  A bite was then taken with Harmonic medial to transected portio of uterine artery to further lateralize the ureter and vessel off the V-care cup.  The patient left adnexal structures were then dissected in similar fashion.  The bladder flap was completed and the bladder mobilized off the V-care cup.  An anterior colpotomy was scores and carried around in a clockwise fashion to free the specimen.  Inspection revealed all pedicles to be hemostatic before proceed with vaginal closure.  Attention was turned to the patient pelvis.  Given the size of the  uterus the specimen was morcellated vaginally using a scalpel, with a long weighted speculum and peon used for retraction.  After delivery of the uterus an operative speculum was placed and the anterior and posterior portion of the vaginal cuff were tagged with long Alice clamps.  The cuff was closed using interupted figure of eight stiches using 0 Maxon suture.  The cuff was hemsotatic at the conclusion of closure without visible or palpable defects.  The indwelling foley catheter was removed.  Cystoscopy was performed noting and intact bladder dome as well as brisk efflux of urine from bother ureteral orifices.  The cystoscopy was removed and the indwelling foley catheter was replaced.  Pneumoperitoneum was re-established, the pelvis was irrigated and all pedicles were once again inspected and noted to be hemostatic.  The cuff appeared well closed and free of bowl or other tissue that may have inadvertently been incorporated into the vaginal closure.  Additional hemostatic agents did not need to be applied.      Pneumoperitoneum was evacuated and trocars were removed.  All trocar sites were then dressed with surgical skin glue.  Sponge needle and instrument counts were correct time two.  The patient tolerated the procedure well and was taken to the recovery room in stable condition.

## 2018-05-20 ENCOUNTER — Encounter: Payer: Self-pay | Admitting: Obstetrics and Gynecology

## 2018-05-20 DIAGNOSIS — D649 Anemia, unspecified: Secondary | ICD-10-CM | POA: Diagnosis not present

## 2018-05-20 DIAGNOSIS — Z79899 Other long term (current) drug therapy: Secondary | ICD-10-CM | POA: Diagnosis not present

## 2018-05-20 DIAGNOSIS — K219 Gastro-esophageal reflux disease without esophagitis: Secondary | ICD-10-CM | POA: Diagnosis not present

## 2018-05-20 DIAGNOSIS — I10 Essential (primary) hypertension: Secondary | ICD-10-CM | POA: Diagnosis not present

## 2018-05-20 DIAGNOSIS — F329 Major depressive disorder, single episode, unspecified: Secondary | ICD-10-CM | POA: Diagnosis not present

## 2018-05-20 DIAGNOSIS — D251 Intramural leiomyoma of uterus: Secondary | ICD-10-CM | POA: Diagnosis not present

## 2018-05-20 DIAGNOSIS — D252 Subserosal leiomyoma of uterus: Secondary | ICD-10-CM | POA: Diagnosis not present

## 2018-05-20 DIAGNOSIS — N920 Excessive and frequent menstruation with regular cycle: Secondary | ICD-10-CM | POA: Diagnosis not present

## 2018-05-20 DIAGNOSIS — Z87440 Personal history of urinary (tract) infections: Secondary | ICD-10-CM | POA: Diagnosis not present

## 2018-05-20 DIAGNOSIS — E785 Hyperlipidemia, unspecified: Secondary | ICD-10-CM | POA: Diagnosis not present

## 2018-05-20 DIAGNOSIS — N802 Endometriosis of fallopian tube: Secondary | ICD-10-CM | POA: Diagnosis not present

## 2018-05-20 DIAGNOSIS — N800 Endometriosis of uterus: Secondary | ICD-10-CM | POA: Diagnosis not present

## 2018-05-20 DIAGNOSIS — N939 Abnormal uterine and vaginal bleeding, unspecified: Secondary | ICD-10-CM | POA: Diagnosis not present

## 2018-05-20 LAB — BASIC METABOLIC PANEL
Anion gap: 3 — ABNORMAL LOW (ref 5–15)
BUN: 12 mg/dL (ref 6–20)
CO2: 27 mmol/L (ref 22–32)
Calcium: 8.6 mg/dL — ABNORMAL LOW (ref 8.9–10.3)
Chloride: 109 mmol/L (ref 98–111)
Creatinine, Ser: 0.93 mg/dL (ref 0.44–1.00)
GFR calc Af Amer: >60 mL/min (ref >60)
GFR calc non Af Amer: >60 mL/min (ref >60)
Glucose, Bld: 139 mg/dL — ABNORMAL HIGH (ref 70–99)
Potassium: 4.5 mmol/L (ref 3.5–5.1)
Sodium: 139 mmol/L (ref 135–145)

## 2018-05-20 LAB — CBC
HCT: 30.9 % — ABNORMAL LOW (ref 35.0–47.0)
Hemoglobin: 10.4 g/dL — ABNORMAL LOW (ref 12.0–16.0)
MCH: 29.4 pg (ref 26.0–34.0)
MCHC: 33.7 g/dL (ref 32.0–36.0)
MCV: 87.1 fL (ref 80.0–100.0)
Platelets: 221 10*3/uL (ref 150–440)
RBC: 3.55 MIL/uL — ABNORMAL LOW (ref 3.80–5.20)
RDW: 16 % — ABNORMAL HIGH (ref 11.5–14.5)
WBC: 7.5 10*3/uL (ref 3.6–11.0)

## 2018-05-20 MED ORDER — DOCUSATE SODIUM 100 MG PO CAPS
100.0000 mg | ORAL_CAPSULE | Freq: Two times a day (BID) | ORAL | Status: DC | PRN
  Administered 2018-05-20: 100 mg via ORAL
  Filled 2018-05-20: qty 1

## 2018-05-20 MED ORDER — OXYCODONE-ACETAMINOPHEN 5-325 MG PO TABS: 1.0000 | ORAL_TABLET | ORAL | 0 refills | Status: DC | PRN

## 2018-05-20 MED ORDER — IBUPROFEN 600 MG PO TABS: 600.0000 mg | ORAL_TABLET | Freq: Four times a day (QID) | ORAL | 3 refills | Status: DC | PRN

## 2018-05-20 NOTE — Progress Notes (Signed)
Dr. Georgianne Fick aware patient would feel more comfortable staying another night. Patient has been up to bathroom x2 walking and voiding without problem, eating w/o problem, taking PO pain medications. Patient updated she will have to be discharged today because she is doing so well. Encouraged patient again to get out of bed more, walk in halls, and up to recliner. Patient states she will after her next pain medication dose.

## 2018-05-20 NOTE — Progress Notes (Signed)
Patient has ate lunch, walked halfway down the hall and back to recliner to sit without assistance, and walked to bathroom independently to void/peri care.   Patient is now back in bed napping again, will have discharge paperwork ready once patient wakes up.

## 2018-05-20 NOTE — Discharge Summary (Signed)
Physician Discharge Summary  Patient ID: Stacey Roberson MRN: [id number] DOB/AGE: May 31, 1971 47 y.o.  Admit date: 05/19/2018 Discharge date: 05/20/2018  Admission Diagnoses:  Discharge Diagnoses:  Active Problems:   S/P laparoscopic hysterectomy   Discharged Condition: good  Hospital Course: 47 y.o. admitted for scheduled hysterectomy which proceeded uncomplicated.  Remained hemodynamically stable and afebrile postoperatively.  Tolerating general diet, pain well controlled on oral analgesics. Postoperative labs showing stable H&H and BUN/Cr  Consults: None  Significant Diagnostic Studies: Results for orders placed or performed during the hospital encounter of 05/19/18 (from the past 24 hour(s))  CBC     Status: Abnormal   Collection Time: 05/20/18  4:49 AM  Result Value Ref Range   WBC 7.5 3.6 - 11.0 K/uL   RBC 3.55 (L) 3.80 - 5.20 MIL/uL   Hemoglobin 10.4 (L) 12.0 - 16.0 g/dL   HCT 30.9 (L) 35.0 - 47.0 %   MCV 87.1 80.0 - 100.0 fL   MCH 29.4 26.0 - 34.0 pg   MCHC 33.7 32.0 - 36.0 g/dL   RDW 16.0 (H) 11.5 - 14.5 %   Platelets 221 150 - 440 K/uL  Basic metabolic panel     Status: Abnormal   Collection Time: 05/20/18  4:49 AM  Result Value Ref Range   Sodium 139 135 - 145 mmol/L   Potassium 4.5 3.5 - 5.1 mmol/L   Chloride 109 98 - 111 mmol/L   CO2 27 22 - 32 mmol/L   Glucose, Bld 139 (H) 70 - 99 mg/dL   BUN 12 6 - 20 mg/dL   Creatinine, Ser 0.93 0.44 - 1.00 mg/dL   Calcium 8.6 (L) 8.9 - 10.3 mg/dL   GFR calc non Af Amer >60 >60 mL/min   GFR calc Af Amer >60 >60 mL/min   Anion gap 3 (L) 5 - 15     Treatments: TLH, BS, cystoscopy  Discharge Exam: Blood pressure 121/65, pulse 79, temperature 98.3 F (36.8 C), temperature source Oral, resp. rate 20, height 5\' 4"  (1.626 m), weight 85.7 kg, SpO2 100 %. General appearance: appears stated age and no distress Resp: clear to auscultation bilaterally GI: soft, non-tender; bowel sounds normal; no masses,  no  organomegaly Extremities: extremities normal, atraumatic, no cyanosis or edema Trocar sites D/C/I  Disposition:    Allergies as of 05/20/2018      Reactions   Bee Venom Swelling   Anaphylaxis    Pollen Extract       Medication List    STOP taking these medications   amLODipine 2.5 MG tablet Commonly known as:  NORVASC   clotrimazole-betamethasone cream Commonly known as:  LOTRISONE   methylPREDNISolone 4 MG tablet Commonly known as:  MEDROL   temazepam 15 MG capsule Commonly known as:  RESTORIL     TAKE these medications   clotrimazole 1 % cream Commonly known as:  LOTRIMIN Apply 1 application topically 2 (two) times daily.   hydrocortisone 2.5 % ointment Apply topically 2 (two) times daily. What changed:  how much to take   ibuprofen 600 MG tablet Commonly known as:  ADVIL,MOTRIN Take 1 tablet (600 mg total) by mouth every 6 (six) hours as needed.   omeprazole 20 MG capsule Commonly known as:  PRILOSEC Take 1 capsule (20 mg total) by mouth daily. On empty stomach   oxyCODONE-acetaminophen 5-325 MG tablet Commonly known as:  PERCOCET/ROXICET Take 1 tablet by mouth every 4 (four) hours as needed for up to 7 days for moderate pain or  severe pain.   sertraline 100 MG tablet Commonly known as:  ZOLOFT Take 1.5 tablets (150 mg total) by mouth daily. What changed:  when to take this      Follow-up Information    Malachy Mood, MD. Schedule an appointment as soon as possible for a visit in 1 week(s).   Specialty:  Obstetrics and Gynecology Contact information: [address] [place] [address] [phone]           Signed: Malachy Mood 05/20/2018, 7:29 AM

## 2018-05-20 NOTE — Progress Notes (Signed)
Patient discharged home with daughter. VSS, pain under control with PO medications, voiding without problem, scant bleeding noted. Discharge instructions and prescriptions given and reviewed with patient. Patient verbalized understanding. Patient states she feels more confident going home after staying a few extra hours and walking around more. Escorted out by staff.

## 2018-05-21 LAB — SURGICAL PATHOLOGY

## 2018-05-25 ENCOUNTER — Encounter: Payer: Self-pay | Admitting: Obstetrics and Gynecology

## 2018-05-25 ENCOUNTER — Ambulatory Visit (INDEPENDENT_AMBULATORY_CARE_PROVIDER_SITE_OTHER): Payer: BLUE CROSS/BLUE SHIELD | Admitting: Obstetrics and Gynecology

## 2018-05-25 VITALS — BP 136/80 | HR 74 | Wt 200.0 lb

## 2018-05-25 DIAGNOSIS — Z4889 Encounter for other specified surgical aftercare: Secondary | ICD-10-CM

## 2018-05-25 DIAGNOSIS — Z9889 Other specified postprocedural states: Secondary | ICD-10-CM

## 2018-05-25 MED ORDER — OXYCODONE-ACETAMINOPHEN 5-325 MG PO TABS: 1.0000 | ORAL_TABLET | ORAL | 0 refills | Status: AC | PRN

## 2018-05-25 NOTE — Progress Notes (Signed)
Postoperative Follow-up Patient presents post op from [place], BS, cystoscopy 1weeks ago for abnormal uterine bleeding and fibroids.  Subjective: Patient reports some improvement in her preop symptoms. Eating a regular diet without difficulty. Pain is not well controlled.  Medications being used: narcotic analgesics including percocet.  Activity: still some limitations in mobility.  Objective: Blood pressure 136/80, pulse 74, weight 200 lb (90.7 kg), last menstrual period 03/08/2018.  Gen: NAD HEENT: normocephalic, anicteric Pulmonary: CTAB Abdomen: soft, non-tender, non-distended, trocar sites D/C/I Ext: no edema  Admission on 05/19/2018, Discharged on 05/20/2018  Component Date Value Ref Range Status  . ABO/RH(D) 05/19/2018    Final                   Value:O POS Performed at [facility], [address] [place], [place] [address]   . Preg Test, Ur 05/19/2018 NEGATIVE  NEGATIVE Final   Comment:        THE SENSITIVITY OF THIS METHODOLOGY IS >24 mIU/mL   . SURGICAL PATHOLOGY 05/19/2018    Final                   Value:Surgical Pathology CASE: [id number] PATIENT: [facility] Surgical Pathology Report     SPECIMEN SUBMITTED: A. Uterus with cervix, bilateral tubes  CLINICAL HISTORY: None provided  PRE-OPERATIVE DIAGNOSIS: Abnormal uterine bleeding, uterine fibroids  POST-OPERATIVE DIAGNOSIS: Same as pre-op     DIAGNOSIS: A. UTERUS WITH CERVIX; HYSTERECTOMY: - MORCELLATED UTERUS, 341 GRAMS. - CERVIX WITHOUT PATHOLOGIC CHANGES. - SECRETORY ENDOMETRIUM. - SEVERE ADENOMYOSIS. - INTRAMURAL AND SUBSEROSAL LEIOMYOMAS. - SEROSAL ADHESIONS.  BILATERAL FALLOPIAN TUBES; SALPINGECTOMY: - ENDOMETRIOSIS OF PARATUBAL TISSUE. - MULTIPLE FALLOPIAN TUBE FRAGMENTS INCLUDING FIMBRIA.   GROSS DESCRIPTION: A. Labeled: Uterus with cervix, bilateral tubes Received: In formalin Weight: 341 grams Dimensions:      Fundus - multiple fragments, aggregate  14.2 x 11.5 x 7.3 cm      Cervix - multiple fragments, re-opposed 3.2 x 3.1 cm Serosa: Pink-tan with focal ecchymosis and adhere                         nt fatty tissue at fundus Cervix: Tan with ecchymosis Endocervix: Trabecular pink-tan Endometrial cavity:      Dimensions - cannot be determined      Thickness - 0.1 cm      Other findings - none noted Myometrium:     Thickness - Intact areas up to 4.3 cm     Other findings - multiple white whorled nodules ranging from 0.5 up to 2.1 cm in greatest dimension, one nodule subserosal is 2.1 cm in greatest dimension and is soft and tan to yellow  Adnexa: Fallopian tubes, one attached 3.2 x 1.1 x 0.8 cm and 3 additional possible fragments with fimbria, aggregate 4.2 x 1.0 x 0.8 cm  Block summary: 1 -2 - representative cervix 3-4 - representative endomyometrium 5-6 - representative white whorled nodules 7-8 - representative yellow to tan nodule 9 - representative cross-sections attached fallopian tube 10 - representative cross-sections and all possible fimbria from remaining 3 fragments of what appears fallopian tube 11 - representative adherent fatty tissue at the f                         undus  Final Diagnosis performed by Bryan Lemma, MD.   Electronically signed 05/21/2018 11:40:20AM The electronic signature indicates that the named Attending Pathologist  has evaluated the specimen  Technical component performed at [facility], [address], [place], [place] [address] Lab: [phone] Dir: Rush Farmer, MD, MMM  Professional component performed at [facility], [facility], [place], [place], [place] [address] Lab: [phone] Dir: Dellia Nims. Rubinas, MD   . WBC 05/20/2018 7.5  3.6 - 11.0 K/uL Final  . RBC 05/20/2018 3.55* 3.80 - 5.20 MIL/uL Final  . Hemoglobin 05/20/2018 10.4* 12.0 - 16.0 g/dL Final  . HCT 05/20/2018 30.9* 35.0 - 47.0 % Final  . MCV 05/20/2018 87.1  80.0 - 100.0 fL Final  . MCH 05/20/2018  29.4  26.0 - 34.0 pg Final  . MCHC 05/20/2018 33.7  32.0 - 36.0 g/dL Final  . RDW 05/20/2018 16.0* 11.5 - 14.5 % Final  . Platelets 05/20/2018 221  150 - 440 K/uL Final   Performed at [facility], [address], [place], [place] [address]  . Sodium 05/20/2018 139  135 - 145 mmol/L Final  . Potassium 05/20/2018 4.5  3.5 - 5.1 mmol/L Final  . Chloride 05/20/2018 109  98 - 111 mmol/L Final  . CO2 05/20/2018 27  22 - 32 mmol/L Final  . Glucose, Bld 05/20/2018 139* 70 - 99 mg/dL Final  . BUN 05/20/2018 12  6 - 20 mg/dL Final  . Creatinine, Ser 05/20/2018 0.93  0.44 - 1.00 mg/dL Final  . Calcium 05/20/2018 8.6* 8.9 - 10.3 mg/dL Final  . GFR calc non Af Amer 05/20/2018 >60  >60 mL/min Final  . GFR calc Af Amer 05/20/2018 >60  >60 mL/min Final   Comment: (NOTE) The eGFR has been calculated using the CKD EPI equation. This calculation has not been validated in all clinical situations. eGFR's persistently <60 mL/min signify possible Chronic Kidney Disease.   Georgiann Hahn gap 05/20/2018 3* 5 - 15 Final   Performed at [facility], [address] [place], [place] [address]    Assessment: 47 y.o. s/p TLH, BS, and cystoscopy stable  Plan: Patient has done well after surgery with no apparent complications.  I have discussed the post-operative course to date, and the expected progress moving forward.  The patient understands what complications to be concerned about.  I will see the patient in routine follow up, or sooner if needed.    Activity plan: No heavy lifting, no intercourse  Refill percocet  Return in about 5 weeks (around 06/29/2018) for postoperative visit.    Malachy Mood, MD, [facility], [facility] 05/25/2018, 11:41 AM

## 2018-05-26 ENCOUNTER — Telehealth: Payer: Self-pay

## 2018-05-26 NOTE — Telephone Encounter (Signed)
Please advise 
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
## 2018-06-02 ENCOUNTER — Ambulatory Visit (INDEPENDENT_AMBULATORY_CARE_PROVIDER_SITE_OTHER): Payer: BLUE CROSS/BLUE SHIELD | Admitting: Obstetrics and Gynecology

## 2018-06-02 ENCOUNTER — Telehealth: Payer: Self-pay

## 2018-06-02 VITALS — BP 124/78 | Ht 64.0 in | Wt 196.0 lb

## 2018-06-02 DIAGNOSIS — N760 Acute vaginitis: Secondary | ICD-10-CM

## 2018-06-02 NOTE — Telephone Encounter (Signed)
Stacey Roberson aware of work-in for this patient. KJ CMA

## 2018-06-02 NOTE — Telephone Encounter (Signed)
Pt states she had a hysterectomy 2 weeks ago with [facility] and is experiencing watery type discharge with slight odor and color. She knows she isn't supposed to use anything vaginally but is there an otc medication she can get.  Advised she probably has BV, which is from the healing process/suture dissolving. Told her I wasn't aware of an oral otc medication but that she would need an abx.   AMS is not back in the office until Thursday. Message sent to SDJ to advise. Her pharmacy is [facility] on [address].

## 2018-06-02 NOTE — Telephone Encounter (Signed)
Pt needs to be seen for this issue.

## 2018-06-04 ENCOUNTER — Encounter: Payer: Self-pay | Admitting: Obstetrics and Gynecology

## 2018-06-04 NOTE — Progress Notes (Signed)
Obstetrics & Gynecology Office Visit   Chief Complaint  Patient presents with  . Vaginitis    History of Present Illness: 47 y.o. G7P4102 female who is two weeks post-op from a total laparoscopic hysterectomy, bilateral salpingectomy by Dr.Staebler. She presents with abnormal discharge, occasionally tinged with old, brown blood.  She denies frank vaginal bleeding. She states the discharge is malodorous.  She denies vaginal irritative symptoms of itching, burning, irritation.  She denies fevers, chills, nausea, emesis. Her pain is well-controlled. She is tolerating a regular diet and has normal bowel and bladder function.  The symptoms have been presents since the evening of her visit with Dr. Georgianne Fick on 05/25/18.    Past Medical History:  Diagnosis Date  . Allergy   . Anemia   . Anxiety   . Arthritis   . Chicken pox   . Depression   . GERD (gastroesophageal reflux disease)   . Headache   . Hyperlipidemia   . Hypertension   . Uterine fibroid   . UTI (urinary tract infection)     Past Surgical History:  Procedure Laterality Date  . CARPAL TUNNEL RELEASE     right 06/2017   . CYSTOSCOPY N/A 05/19/2018   Procedure: CYSTOSCOPY;  Surgeon: Malachy Mood, MD;  Location: [facility] ORS;  Service: Gynecology;  Laterality: N/A;  . ENDOMETRIAL BIOPSY     neg  . EYE SURGERY Bilateral 2001  . LAPAROSCOPIC HYSTERECTOMY Bilateral 05/19/2018   Procedure: HYSTERECTOMY TOTAL LAPAROSCOPIC, BILATERAL SALPINGECTOMY;  Surgeon: Malachy Mood, MD;  Location: [facility] OR[facility];  Service: Gynecology;  Laterality: Bilateral;  . TUBAL LIGATION     1999    Gynecologic History: Patient's last menstrual period was 03/08/2018 (exact date).  Obstetric History: [id number]  Family History  Problem Relation Age of Onset  . Healthy Mother   . Arthritis Father   . Hypertension Father   . Healthy Brother   . Arthritis Brother   . Hypertension Brother   . Stroke Brother 30    Social History   Socioeconomic  History  . Marital status: Single    Spouse name: Not on file  . Number of children: 3  . Years of education: Not on file  . Highest education level: Not on file  Occupational History  . Occupation: [employer]  Social Needs  . Financial resource strain: Not on file  . Food insecurity:    Worry: Not on file    Inability: Not on file  . Transportation needs:    Medical: Not on file    Non-medical: Not on file  Tobacco Use  . Smoking status: Light Tobacco Smoker    Types: Cigarettes  . Smokeless tobacco: Never Used  Substance and Sexual Activity  . Alcohol use: Yes  . Drug use: No  . Sexual activity: Yes  Lifestyle  . Physical activity:    Days per week: 0 days    Minutes per session: 0 min  . Stress: Very much  Relationships  . Social connections:    Talks on phone: Not on file    Gets together: Not on file    Attends religious service: Not on file    Active member of club or organization: Not on file    Attends meetings of clubs or organizations: Not on file    Relationship status: Not on file  . Intimate partner violence:    Fear of current or ex partner: Not on file    Emotionally abused: Not on file    Physically  abused: Not on file    Forced sexual activity: Not on file  Other Topics Concern  . Not on file  Social History Narrative   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 lives with boyfriend    Daughters, 4 kids total    No guns, wears seat belt     Allergies  Allergen Reactions  . Bee Venom Swelling    Anaphylaxis   . Pollen Extract     Prior to Admission medications   Medication Sig Start Date End Date Taking? Authorizing Provider  clotrimazole (LOTRIMIN) 1 % cream Apply 1 application topically 2 (two) times daily. 01/28/18   McLean-Scocuzza, Nino Glow, MD  hydrocortisone 2.5 % ointment Apply topically 2 (two) times daily. Patient taking differently: Apply 1 application topically 2 (two) times daily.  01/28/18   McLean-Scocuzza, Nino Glow, MD    ibuprofen (ADVIL,MOTRIN) 600 MG tablet Take 1 tablet (600 mg total) by mouth every 6 (six) hours as needed. 05/20/18   Malachy Mood, MD  omeprazole (PRILOSEC) 20 MG capsule Take 1 capsule (20 mg total) by mouth daily. On empty stomach 01/28/18   McLean-Scocuzza, Nino Glow, MD  sertraline (ZOLOFT) 100 MG tablet Take 1.5 tablets (150 mg total) by mouth daily. Patient taking differently: Take 150 mg by mouth at bedtime.  01/28/18 01/28/19  McLean-Scocuzza, Nino Glow, MD    Review of Systems  Constitutional: Negative.   HENT: Negative.   Eyes: Negative.   Respiratory: Negative.   Cardiovascular: Negative.   Gastrointestinal: Negative.   Genitourinary: Negative.        See HPI  Musculoskeletal: Negative.   Skin: Negative.   Neurological: Negative.   Psychiatric/Behavioral: Negative.      Physical Exam BP 124/78   Ht 5\' 4"  (1.626 m)   Wt 196 lb (88.9 kg)   LMP 03/08/2018 (Exact Date)   BMI 33.64 kg/m  Patient's last menstrual period was 03/08/2018 (exact date). Physical Exam  Constitutional: She is oriented to person, place, and time. She appears well-developed and well-nourished. No distress.  Genitourinary: Vagina normal. Pelvic exam was performed with patient supine. There is no rash, tenderness or lesion on the right labia. There is no rash, tenderness or lesion on the left labia. Right adnexum does not display mass, does not display tenderness and does not display fullness. Left adnexum does not display mass, does not display tenderness and does not display fullness.  Genitourinary Comments: Vagina without frank blood. No obvious old blood. The vaginal cuff suture line is visible and intact. It is non-tender to palpation without fullness or induration.   HENT:  Head: Normocephalic and atraumatic.  Eyes: Conjunctivae are normal. No scleral icterus.  Cardiovascular: Normal rate and regular rhythm. Exam reveals no gallop and no friction rub.  No murmur heard. Pulmonary/Chest: Breath  sounds normal. No respiratory distress. She has no wheezes.  Abdominal: Soft. Bowel sounds are normal. She exhibits no distension and no mass. There is no tenderness. There is no rebound and no guarding.  All port incision sites are clean, dry, and intact without erythema, induration, warmth, and tenderness  Musculoskeletal: Normal range of motion. She exhibits no edema.  Neurological: She is alert and oriented to person, place, and time. No cranial nerve deficit.  Skin: Skin is warm and dry. No erythema.  Psychiatric: She has a normal mood and affect. Her behavior is normal. Judgment normal.   Female chaperone present for pelvic and breast  portions of the physical exam  Wet Prep: PH: 5.5 Clue Cells: Negative Fungal elements: Negative Trichomonas: Negative   Assessment: 47 y.o. [id number] female here for  1. Acute vaginitis      Plan: Problem List Items Addressed This Visit    None    Visit Diagnoses    Acute vaginitis    -  Primary     Patient reassured that some discharge may be normal. If she has any frank red bleeding or worsening of symptoms, she is encouraged to return to clinic.   15 minutes spent in face to face discussion with > 50% spent in counseling,management, and coordination of care of her acute vaginitis.   Prentice Docker, MD 06/02/2018 4:45 PM

## 2018-06-25 DIAGNOSIS — L309 Dermatitis, unspecified: Secondary | ICD-10-CM | POA: Diagnosis not present

## 2018-06-29 ENCOUNTER — Encounter: Payer: Self-pay | Admitting: Obstetrics and Gynecology

## 2018-06-29 ENCOUNTER — Ambulatory Visit (INDEPENDENT_AMBULATORY_CARE_PROVIDER_SITE_OTHER): Payer: BLUE CROSS/BLUE SHIELD | Admitting: Obstetrics and Gynecology

## 2018-06-29 VITALS — BP 140/88 | HR 112 | Temp 98.2°F | Wt 202.0 lb

## 2018-06-29 DIAGNOSIS — Z4889 Encounter for other specified surgical aftercare: Secondary | ICD-10-CM

## 2018-06-29 DIAGNOSIS — Z9889 Other specified postprocedural states: Secondary | ICD-10-CM

## 2018-06-29 NOTE — Progress Notes (Signed)
Postoperative Follow-up Patient presents post op from [place], BS, and cystoscopy 6weeks ago for abnormal uterine bleeding.  Subjective: Patient reports some improvement in her preop symptoms. Eating a regular diet without difficulty. Pain is controlled without any medications.  Activity: still reports limitation and pain with lifting, certain movements.  Objective: Blood pressure 140/88, pulse (!) 112, temperature 98.2 F (36.8 C), weight 202 lb (91.6 kg), last menstrual period 03/08/2018.  General: NAD Pulmonary: no increased work of breathing Abdomen: soft, non-tender, non-distended, incision(s) D/C/I GU: normal external female genitalia vaginal cuff intact, well healed Extremities: no edema Neurologic: normal gait    Admission on 05/19/2018, Discharged on 05/20/2018  Component Date Value Ref Range Status  . ABO/RH(D) 05/19/2018    Final                   Value:O POS Performed at [facility], [address], [place], [place] [address]   . Preg Test, Ur 05/19/2018 NEGATIVE  NEGATIVE Final   Comment:        THE SENSITIVITY OF THIS METHODOLOGY IS >24 mIU/mL   . SURGICAL PATHOLOGY 05/19/2018    Final                   Value:Surgical Pathology CASE: [id number] PATIENT: [facility] Surgical Pathology Report     SPECIMEN SUBMITTED: A. Uterus with cervix, bilateral tubes  CLINICAL HISTORY: None provided  PRE-OPERATIVE DIAGNOSIS: Abnormal uterine bleeding, uterine fibroids  POST-OPERATIVE DIAGNOSIS: Same as pre-op     DIAGNOSIS: A. UTERUS WITH CERVIX; HYSTERECTOMY: - MORCELLATED UTERUS, 341 GRAMS. - CERVIX WITHOUT PATHOLOGIC CHANGES. - SECRETORY ENDOMETRIUM. - SEVERE ADENOMYOSIS. - INTRAMURAL AND SUBSEROSAL LEIOMYOMAS. - SEROSAL ADHESIONS.  BILATERAL FALLOPIAN TUBES; SALPINGECTOMY: - ENDOMETRIOSIS OF PARATUBAL TISSUE. - MULTIPLE FALLOPIAN TUBE FRAGMENTS INCLUDING FIMBRIA.   GROSS DESCRIPTION: A. Labeled: Uterus with cervix,  bilateral tubes Received: In formalin Weight: 341 grams Dimensions:      Fundus - multiple fragments, aggregate 14.2 x 11.5 x 7.3 cm      Cervix - multiple fragments, re-opposed 3.2 x 3.1 cm Serosa: Pink-tan with focal ecchymosis and adhere                         nt fatty tissue at fundus Cervix: Tan with ecchymosis Endocervix: Trabecular pink-tan Endometrial cavity:      Dimensions - cannot be determined      Thickness - 0.1 cm      Other findings - none noted Myometrium:     Thickness - Intact areas up to 4.3 cm     Other findings - multiple white whorled nodules ranging from 0.5 up to 2.1 cm in greatest dimension, one nodule subserosal is 2.1 cm in greatest dimension and is soft and tan to yellow  Adnexa: Fallopian tubes, one attached 3.2 x 1.1 x 0.8 cm and 3 additional possible fragments with fimbria, aggregate 4.2 x 1.0 x 0.8 cm  Block summary: 1 -2 - representative cervix 3-4 - representative endomyometrium 5-6 - representative white whorled nodules 7-8 - representative yellow to tan nodule 9 - representative cross-sections attached fallopian tube 10 - representative cross-sections and all possible fimbria from remaining 3 fragments of what appears fallopian tube 11 - representative adherent fatty tissue at the f                         undus  Final Diagnosis performed by [facility]  Dicie Beam, MD.   Electronically signed 05/21/2018 11:40:20AM The electronic signature indicates that the named Attending Pathologist has evaluated the specimen  Technical component performed at [facility], [address] [place], [place] [address] Lab: [phone] Dir: Rush Farmer, MD, MMM  Professional component performed at [facility], [facility], [place], [place], [place] [address] Lab: [phone] Dir: Dellia Nims. Rubinas, MD   . WBC 05/20/2018 7.5  3.6 - 11.0 K/uL Final  . RBC 05/20/2018 3.55* 3.80 - 5.20 MIL/uL Final  . Hemoglobin 05/20/2018 10.4* 12.0 - 16.0 g/dL  Final  . HCT 05/20/2018 30.9* 35.0 - 47.0 % Final  . MCV 05/20/2018 87.1  80.0 - 100.0 fL Final  . MCH 05/20/2018 29.4  26.0 - 34.0 pg Final  . MCHC 05/20/2018 33.7  32.0 - 36.0 g/dL Final  . RDW 05/20/2018 16.0* 11.5 - 14.5 % Final  . Platelets 05/20/2018 221  150 - 440 K/uL Final   Performed at [facility], [address] [place], [place] [address]  . Sodium 05/20/2018 139  135 - 145 mmol/L Final  . Potassium 05/20/2018 4.5  3.5 - 5.1 mmol/L Final  . Chloride 05/20/2018 109  98 - 111 mmol/L Final  . CO2 05/20/2018 27  22 - 32 mmol/L Final  . Glucose, Bld 05/20/2018 139* 70 - 99 mg/dL Final  . BUN 05/20/2018 12  6 - 20 mg/dL Final  . Creatinine, Ser 05/20/2018 0.93  0.44 - 1.00 mg/dL Final  . Calcium 05/20/2018 8.6* 8.9 - 10.3 mg/dL Final  . GFR calc non Af Amer 05/20/2018 >60  >60 mL/min Final  . GFR calc Af Amer 05/20/2018 >60  >60 mL/min Final   Comment: (NOTE) The eGFR has been calculated using the CKD EPI equation. This calculation has not been validated in all clinical situations. eGFR's persistently <60 mL/min signify possible Chronic Kidney Disease.   Georgiann Hahn gap 05/20/2018 3* 5 - 15 Final   Performed at [facility], [address] [place], [place] [address]    Assessment: 47 y.o. s/p TLH, BS and cystoscopy stable  Plan: Patient has done well after surgery with no apparent complications.  I have discussed the post-operative course to date, and the expected progress moving forward.  The patient understands what complications to be concerned about.  I will see the patient in routine follow up, or sooner if needed.    Activity plan: No restriction.  Patient still reports pain and discomfort with certain movement and lifting.  Will allow additional 2 weeks of recovery and re-examine at that time.   Malachy Mood, MD, [facility], [facility] 06/29/2018, 8:57 PM  Well healed cuff Follow up 2 weeks still concerned about  lifting

## 2018-07-03 ENCOUNTER — Ambulatory Visit (INDEPENDENT_AMBULATORY_CARE_PROVIDER_SITE_OTHER): Payer: BLUE CROSS/BLUE SHIELD

## 2018-07-03 ENCOUNTER — Ambulatory Visit (INDEPENDENT_AMBULATORY_CARE_PROVIDER_SITE_OTHER): Payer: BLUE CROSS/BLUE SHIELD | Admitting: Internal Medicine

## 2018-07-03 ENCOUNTER — Encounter: Payer: Self-pay | Admitting: Internal Medicine

## 2018-07-03 VITALS — BP 126/76 | HR 100 | Temp 98.3°F | Ht 64.0 in | Wt 204.2 lb

## 2018-07-03 DIAGNOSIS — J329 Chronic sinusitis, unspecified: Secondary | ICD-10-CM

## 2018-07-03 DIAGNOSIS — M25512 Pain in left shoulder: Secondary | ICD-10-CM

## 2018-07-03 DIAGNOSIS — K219 Gastro-esophageal reflux disease without esophagitis: Secondary | ICD-10-CM

## 2018-07-03 DIAGNOSIS — R21 Rash and other nonspecific skin eruption: Secondary | ICD-10-CM

## 2018-07-03 DIAGNOSIS — G4700 Insomnia, unspecified: Secondary | ICD-10-CM | POA: Diagnosis not present

## 2018-07-03 DIAGNOSIS — F329 Major depressive disorder, single episode, unspecified: Secondary | ICD-10-CM

## 2018-07-03 DIAGNOSIS — M67972 Unspecified disorder of synovium and tendon, left ankle and foot: Secondary | ICD-10-CM

## 2018-07-03 DIAGNOSIS — B353 Tinea pedis: Secondary | ICD-10-CM | POA: Diagnosis not present

## 2018-07-03 DIAGNOSIS — S4992XA Unspecified injury of left shoulder and upper arm, initial encounter: Secondary | ICD-10-CM | POA: Diagnosis not present

## 2018-07-03 DIAGNOSIS — Z1231 Encounter for screening mammogram for malignant neoplasm of breast: Secondary | ICD-10-CM

## 2018-07-03 DIAGNOSIS — F32A Depression, unspecified: Secondary | ICD-10-CM

## 2018-07-03 DIAGNOSIS — J069 Acute upper respiratory infection, unspecified: Secondary | ICD-10-CM

## 2018-07-03 DIAGNOSIS — F419 Anxiety disorder, unspecified: Secondary | ICD-10-CM

## 2018-07-03 MED ORDER — CLOTRIMAZOLE 1 % EX CREA: 1.0000 "application " | TOPICAL_CREAM | Freq: Two times a day (BID) | CUTANEOUS | 2 refills | Status: DC

## 2018-07-03 MED ORDER — ZOLPIDEM TARTRATE 5 MG PO TABS: 5.0000 mg | ORAL_TABLET | Freq: Every evening | ORAL | 0 refills | Status: DC | PRN

## 2018-07-03 MED ORDER — OMEPRAZOLE 20 MG PO CPDR: 20.0000 mg | DELAYED_RELEASE_CAPSULE | Freq: Every day | ORAL | 3 refills | Status: DC

## 2018-07-03 MED ORDER — DOXYCYCLINE HYCLATE 100 MG PO TABS: 100.0000 mg | ORAL_TABLET | Freq: Two times a day (BID) | ORAL | 0 refills | Status: DC

## 2018-07-03 MED ORDER — CETIRIZINE HCL 10 MG PO TABS: 10.0000 mg | ORAL_TABLET | Freq: Every day | ORAL | 3 refills | Status: DC

## 2018-07-03 MED ORDER — FLUTICASONE PROPIONATE 50 MCG/ACT NA SUSP: 2.0000 | Freq: Every day | NASAL | 12 refills | Status: DC

## 2018-07-03 MED ORDER — TRIAMCINOLONE ACETONIDE 0.1 % EX CREA: 1.0000 "application " | TOPICAL_CREAM | Freq: Two times a day (BID) | CUTANEOUS | 0 refills | Status: DC

## 2018-07-03 NOTE — Patient Instructions (Addendum)
[facility] dermatology [place] [phone]  [facility] [address] [phone]  Mammogram go to facility and schedule please    Insomnia Insomnia is a sleep disorder that makes it difficult to fall asleep or to stay asleep. Insomnia can cause tiredness (fatigue), low energy, difficulty concentrating, mood swings, and poor performance at work or school. There are three different ways to classify insomnia:  Difficulty falling asleep.  Difficulty staying asleep.  Waking up too early in the morning.  Any type of insomnia can be long-term (chronic) or short-term (acute). Both are common. Short-term insomnia usually lasts for three months or less. Chronic insomnia occurs at least three times a week for longer than three months. What are the causes? Insomnia may be caused by another condition, situation, or substance, such as:  Anxiety.  Certain medicines.  Gastroesophageal reflux disease (GERD) or other gastrointestinal conditions.  Asthma or other breathing conditions.  Restless legs syndrome, sleep apnea, or other sleep disorders.  Chronic pain.  Menopause. This may include hot flashes.  Stroke.  Abuse of alcohol, tobacco, or illegal drugs.  Depression.  Caffeine.  Neurological disorders, such as Alzheimer disease.  An overactive thyroid (hyperthyroidism).  The cause of insomnia may not be known. What increases the risk? Risk factors for insomnia include:  Gender. Women are more commonly affected than men.  Age. Insomnia is more common as you get older.  Stress. This may involve your professional or personal life.  Income. Insomnia is more common in people with lower income.  Lack of exercise.  Irregular work schedule or night shifts.  Traveling between different time zones.  What are the signs or symptoms? If you have insomnia, trouble falling asleep or trouble staying asleep is the main symptom. This may lead to other symptoms, such as:  Feeling  fatigued.  Feeling nervous about going to sleep.  Not feeling rested in the morning.  Having trouble concentrating.  Feeling irritable, anxious, or depressed.  How is this treated? Treatment for insomnia depends on the cause. If your insomnia is caused by an underlying condition, treatment will focus on addressing the condition. Treatment may also include:  Medicines to help you sleep.  Counseling or therapy.  Lifestyle adjustments.  Follow these instructions at home:  Take medicines only as directed by your health care provider.  Keep regular sleeping and waking hours. Avoid naps.  Keep a sleep diary to help you and your health care provider figure out what could be causing your insomnia. Include: ? When you sleep. ? When you wake up during the night. ? How well you sleep. ? How rested you feel the next day. ? Any side effects of medicines you are taking. ? What you eat and drink.  Make your bedroom a comfortable place where it is easy to fall asleep: ? Put up shades or special blackout curtains to block light from outside. ? Use a white noise machine to block noise. ? Keep the temperature cool.  Exercise regularly as directed by your health care provider. Avoid exercising right before bedtime.  Use relaxation techniques to manage stress. Ask your health care provider to suggest some techniques that may work well for you. These may include: ? Breathing exercises. ? Routines to release muscle tension. ? Visualizing peaceful scenes.  Cut back on alcohol, caffeinated beverages, and cigarettes, especially close to bedtime. These can disrupt your sleep.  Do not overeat or eat spicy foods right before bedtime. This can lead to digestive discomfort that  can make it hard for you to sleep.  Limit screen use before bedtime. This includes: ? Watching TV. ? Using your smartphone, tablet, and computer.  Stick to a routine. This can help you fall asleep faster. Try to do a  quiet activity, brush your teeth, and go to bed at the same time each night.  Get out of bed if you are still awake after 15 minutes of trying to sleep. Keep the lights down, but try reading or doing a quiet activity. When you feel sleepy, go back to bed.  Make sure that you drive carefully. Avoid driving if you feel very sleepy.  Keep all follow-up appointments as directed by your health care provider. This is important. Contact a health care provider if:  You are tired throughout the day or have trouble in your daily routine due to sleepiness.  You continue to have sleep problems or your sleep problems get worse. Get help right away if:  You have serious thoughts about hurting yourself or someone else. This information is not intended to replace advice given to you by your health care provider. Make sure you discuss any questions you have with your health care provider. Document Released: 08/23/2000 Document Revised: 01/26/2016 Document Reviewed: 05/27/2014 Elsevier Interactive Patient Education  [employer].
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
## 2018-07-03 NOTE — Progress Notes (Signed)
Pre visit review using our clinic review tool, if applicable. No additional management support is needed unless otherwise documented below in the visit note. 

## 2018-07-09 ENCOUNTER — Other Ambulatory Visit: Payer: Self-pay | Admitting: Internal Medicine

## 2018-07-09 ENCOUNTER — Telehealth: Payer: Self-pay

## 2018-07-09 DIAGNOSIS — M549 Dorsalgia, unspecified: Secondary | ICD-10-CM

## 2018-07-09 DIAGNOSIS — M25552 Pain in left hip: Principal | ICD-10-CM

## 2018-07-09 DIAGNOSIS — M25551 Pain in right hip: Secondary | ICD-10-CM

## 2018-07-09 NOTE — Telephone Encounter (Signed)
Refer to [place] [address]. Roland Rack   Thanks [employer]

## 2018-07-09 NOTE — Telephone Encounter (Signed)
Copied from [place]. Topic: Referral - Request for Referral >> Jul 09, 2018 10:58 AM Yvette Rack wrote: Has patient seen PCP for this complaint? no *If NO, is insurance requiring patient see PCP for this issue before PCP can refer them? no Referral for which specialty: Pt request referral to a specialist for her hips and back Preferred provider/office: no specific provider or location Reason for referral: pain in hips and back

## 2018-07-13 ENCOUNTER — Ambulatory Visit (INDEPENDENT_AMBULATORY_CARE_PROVIDER_SITE_OTHER): Payer: BLUE CROSS/BLUE SHIELD | Admitting: Obstetrics and Gynecology

## 2018-07-13 ENCOUNTER — Encounter: Payer: Self-pay | Admitting: Obstetrics and Gynecology

## 2018-07-13 VITALS — BP 144/86 | HR 105 | Wt 208.0 lb

## 2018-07-13 DIAGNOSIS — Z4889 Encounter for other specified surgical aftercare: Secondary | ICD-10-CM

## 2018-07-13 NOTE — Progress Notes (Signed)
Chief Complaint  Patient presents with  . Routine Post Op     Postoperative Follow-up Patient presents post op from [place], cystoscopy 8weeks ago for abnormal uterine bleeding.  Subjective: Patient reports some improvement in her preop symptoms. Eating a regular diet without difficulty. The patient is not having any pain.  Activity: normal activities of daily living.  Objective: Blood pressure (!) 144/86, pulse (!) 105, weight 208 lb (94.3 kg), last menstrual period 03/08/2018.  General: NAD Pulmonary: no increased work of breathing Abdomen: soft, non-tender, non-distended, incision(s) D/C/I Extremities: no edema Neurologic: normal gait    Admission on 05/19/2018, Discharged on 05/20/2018  Component Date Value Ref Range Status  . ABO/RH(D) 05/19/2018    Final                   Value:O POS Performed at [facility], [address] [place], [place] [address]   . Preg Test, Ur 05/19/2018 NEGATIVE  NEGATIVE Final   Comment:        THE SENSITIVITY OF THIS METHODOLOGY IS >24 mIU/mL   . SURGICAL PATHOLOGY 05/19/2018    Final                   Value:Surgical Pathology CASE: [id number] PATIENT: [facility] Surgical Pathology Report     SPECIMEN SUBMITTED: A. Uterus with cervix, bilateral tubes  CLINICAL HISTORY: None provided  PRE-OPERATIVE DIAGNOSIS: Abnormal uterine bleeding, uterine fibroids  POST-OPERATIVE DIAGNOSIS: Same as pre-op     DIAGNOSIS: A. UTERUS WITH CERVIX; HYSTERECTOMY: - MORCELLATED UTERUS, 341 GRAMS. - CERVIX WITHOUT PATHOLOGIC CHANGES. - SECRETORY ENDOMETRIUM. - SEVERE ADENOMYOSIS. - INTRAMURAL AND SUBSEROSAL LEIOMYOMAS. - SEROSAL ADHESIONS.  BILATERAL FALLOPIAN TUBES; SALPINGECTOMY: - ENDOMETRIOSIS OF PARATUBAL TISSUE. - MULTIPLE FALLOPIAN TUBE FRAGMENTS INCLUDING FIMBRIA.   GROSS DESCRIPTION: A. Labeled: Uterus with cervix, bilateral tubes Received: In formalin Weight: 341 grams Dimensions:      Fundus -  multiple fragments, aggregate 14.2 x 11.5 x 7.3 cm      Cervix - multiple fragments, re-opposed 3.2 x 3.1 cm Serosa: Pink-tan with focal ecchymosis and adhere                         nt fatty tissue at fundus Cervix: Tan with ecchymosis Endocervix: Trabecular pink-tan Endometrial cavity:      Dimensions - cannot be determined      Thickness - 0.1 cm      Other findings - none noted Myometrium:     Thickness - Intact areas up to 4.3 cm     Other findings - multiple white whorled nodules ranging from 0.5 up to 2.1 cm in greatest dimension, one nodule subserosal is 2.1 cm in greatest dimension and is soft and tan to yellow  Adnexa: Fallopian tubes, one attached 3.2 x 1.1 x 0.8 cm and 3 additional possible fragments with fimbria, aggregate 4.2 x 1.0 x 0.8 cm  Block summary: 1 -2 - representative cervix 3-4 - representative endomyometrium 5-6 - representative white whorled nodules 7-8 - representative yellow to tan nodule 9 - representative cross-sections attached fallopian tube 10 - representative cross-sections and all possible fimbria from remaining 3 fragments of what appears fallopian tube 11 - representative adherent fatty tissue at the f                         undus  Final Diagnosis performed by Bryan Lemma, MD.   Electronically signed  05/21/2018 11:40:20AM The electronic signature indicates that the named Attending Pathologist has evaluated the specimen  Technical component performed at [facility], [address] [place], [place] [address] Lab: [phone] Dir: Rush Farmer, MD, MMM  Professional component performed at [facility], [facility], [place], [place], [place] [address] Lab: [phone] Dir: Dellia Nims. Rubinas, MD   . WBC 05/20/2018 7.5  3.6 - 11.0 K/uL Final  . RBC 05/20/2018 3.55* 3.80 - 5.20 MIL/uL Final  . Hemoglobin 05/20/2018 10.4* 12.0 - 16.0 g/dL Final  . HCT 05/20/2018 30.9* 35.0 - 47.0 % Final  . MCV 05/20/2018 87.1  80.0 - 100.0  fL Final  . MCH 05/20/2018 29.4  26.0 - 34.0 pg Final  . MCHC 05/20/2018 33.7  32.0 - 36.0 g/dL Final  . RDW 05/20/2018 16.0* 11.5 - 14.5 % Final  . Platelets 05/20/2018 221  150 - 440 K/uL Final   Performed at [facility], [address] [place], [place] [address]  . Sodium 05/20/2018 139  135 - 145 mmol/L Final  . Potassium 05/20/2018 4.5  3.5 - 5.1 mmol/L Final  . Chloride 05/20/2018 109  98 - 111 mmol/L Final  . CO2 05/20/2018 27  22 - 32 mmol/L Final  . Glucose, Bld 05/20/2018 139* 70 - 99 mg/dL Final  . BUN 05/20/2018 12  6 - 20 mg/dL Final  . Creatinine, Ser 05/20/2018 0.93  0.44 - 1.00 mg/dL Final  . Calcium 05/20/2018 8.6* 8.9 - 10.3 mg/dL Final  . GFR calc non Af Amer 05/20/2018 >60  >60 mL/min Final  . GFR calc Af Amer 05/20/2018 >60  >60 mL/min Final   Comment: (NOTE) The eGFR has been calculated using the CKD EPI equation. This calculation has not been validated in all clinical situations. eGFR's persistently <60 mL/min signify possible Chronic Kidney Disease.   Georgiann Hahn gap 05/20/2018 3* 5 - 15 Final   Performed at [facility], [address] [place], [place] [address]    Assessment: 47 y.o. s/p TLH, cystoscopy stable  Plan: Patient has done well after surgery with no apparent complications.  I have discussed the post-operative course to date, and the expected progress moving forward.  The patient understands what complications to be concerned about.  I will see the patient in routine follow up, or sooner if needed.    Activity plan: No restriction.   Malachy Mood, MD, [facility], [place]

## 2018-07-21 DIAGNOSIS — M5441 Lumbago with sciatica, right side: Secondary | ICD-10-CM | POA: Diagnosis not present

## 2018-07-21 DIAGNOSIS — M47816 Spondylosis without myelopathy or radiculopathy, lumbar region: Secondary | ICD-10-CM | POA: Diagnosis not present

## 2018-07-21 DIAGNOSIS — M545 Low back pain: Secondary | ICD-10-CM | POA: Diagnosis not present

## 2018-07-21 DIAGNOSIS — M47818 Spondylosis without myelopathy or radiculopathy, sacral and sacrococcygeal region: Secondary | ICD-10-CM | POA: Diagnosis not present

## 2018-07-21 DIAGNOSIS — G8929 Other chronic pain: Secondary | ICD-10-CM | POA: Diagnosis not present

## 2018-07-29 ENCOUNTER — Telehealth: Payer: Self-pay

## 2018-07-29 NOTE — Telephone Encounter (Signed)
FMLA/DISABILITY form for [employer] filled out, signature obtained and given to [place] for processing.

## 2018-08-05 ENCOUNTER — Ambulatory Visit (INDEPENDENT_AMBULATORY_CARE_PROVIDER_SITE_OTHER): Payer: BLUE CROSS/BLUE SHIELD

## 2018-08-05 ENCOUNTER — Ambulatory Visit (INDEPENDENT_AMBULATORY_CARE_PROVIDER_SITE_OTHER): Payer: BLUE CROSS/BLUE SHIELD | Admitting: Internal Medicine

## 2018-08-05 ENCOUNTER — Encounter: Payer: Self-pay | Admitting: Internal Medicine

## 2018-08-05 VITALS — BP 140/70 | HR 110 | Temp 97.9°F | Ht 64.0 in | Wt 208.4 lb

## 2018-08-05 DIAGNOSIS — M5136 Other intervertebral disc degeneration, lumbar region: Secondary | ICD-10-CM | POA: Diagnosis not present

## 2018-08-05 DIAGNOSIS — M79672 Pain in left foot: Secondary | ICD-10-CM

## 2018-08-05 DIAGNOSIS — Z0000 Encounter for general adult medical examination without abnormal findings: Secondary | ICD-10-CM

## 2018-08-05 DIAGNOSIS — F419 Anxiety disorder, unspecified: Secondary | ICD-10-CM

## 2018-08-05 DIAGNOSIS — Z23 Encounter for immunization: Secondary | ICD-10-CM

## 2018-08-05 DIAGNOSIS — Z1159 Encounter for screening for other viral diseases: Secondary | ICD-10-CM

## 2018-08-05 DIAGNOSIS — M542 Cervicalgia: Secondary | ICD-10-CM | POA: Diagnosis not present

## 2018-08-05 DIAGNOSIS — Z1329 Encounter for screening for other suspected endocrine disorder: Secondary | ICD-10-CM

## 2018-08-05 DIAGNOSIS — Z13220 Encounter for screening for lipoid disorders: Secondary | ICD-10-CM

## 2018-08-05 DIAGNOSIS — G4700 Insomnia, unspecified: Secondary | ICD-10-CM

## 2018-08-05 DIAGNOSIS — E559 Vitamin D deficiency, unspecified: Secondary | ICD-10-CM

## 2018-08-05 DIAGNOSIS — M62838 Other muscle spasm: Secondary | ICD-10-CM | POA: Diagnosis not present

## 2018-08-05 DIAGNOSIS — M5416 Radiculopathy, lumbar region: Secondary | ICD-10-CM

## 2018-08-05 DIAGNOSIS — M79671 Pain in right foot: Secondary | ICD-10-CM | POA: Insufficient documentation

## 2018-08-05 DIAGNOSIS — F32A Depression, unspecified: Secondary | ICD-10-CM

## 2018-08-05 DIAGNOSIS — D649 Anemia, unspecified: Secondary | ICD-10-CM | POA: Insufficient documentation

## 2018-08-05 DIAGNOSIS — Z0184 Encounter for antibody response examination: Secondary | ICD-10-CM

## 2018-08-05 DIAGNOSIS — F329 Major depressive disorder, single episode, unspecified: Secondary | ICD-10-CM

## 2018-08-05 DIAGNOSIS — M51369 Other intervertebral disc degeneration, lumbar region without mention of lumbar back pain or lower extremity pain: Secondary | ICD-10-CM

## 2018-08-05 DIAGNOSIS — Z1389 Encounter for screening for other disorder: Secondary | ICD-10-CM

## 2018-08-05 DIAGNOSIS — R739 Hyperglycemia, unspecified: Secondary | ICD-10-CM

## 2018-08-05 MED ORDER — CYCLOBENZAPRINE HCL 5 MG PO TABS: 5.0000 mg | ORAL_TABLET | Freq: Every evening | ORAL | 2 refills | Status: DC | PRN

## 2018-08-05 MED ORDER — ZOLPIDEM TARTRATE 5 MG PO TABS: 5.0000 mg | ORAL_TABLET | Freq: Every evening | ORAL | 2 refills | Status: DC | PRN

## 2018-08-05 NOTE — Patient Instructions (Addendum)
Muscle Cramps and Spasms Muscle cramps and spasms occur when a muscle or muscles tighten and you have no control over this tightening (involuntary muscle contraction). They are a common problem and can develop in any muscle. The most common place is in the calf muscles of the leg. Muscle cramps and muscle spasms are both involuntary muscle contractions, but there are some differences between the two:  Muscle cramps are painful. They come and go and may last a few seconds to 15 minutes. Muscle cramps are often more forceful and last longer than muscle spasms.  Muscle spasms may or may not be painful. They may also last just a few seconds or much longer.  Certain medical conditions, such as diabetes or Parkinson disease, can make it more likely to develop cramps or spasms. However, cramps or spasms are usually not caused by a serious underlying problem. Common causes include:  Overexertion.  Overuse from repetitive motions, or doing the same thing over and over.  Remaining in a certain position for a long period of time.  Improper preparation, form, or technique while playing a sport or doing an activity.  Dehydration.  Injury.  Side effects of some medicines.  Abnormally low levels of the salts and ions in your blood (electrolytes), especially potassium and calcium. This could happen if you are taking water pills (diuretics) or if you are pregnant.  In many cases, the cause of muscle cramps or spasms is unknown. Follow these instructions at home:  Stay well hydrated. Drink enough fluid to keep your urine clear or pale yellow.  Try massaging, stretching, and relaxing the affected muscle.  If directed, apply heat to tight or tense muscles as often as told by your health care provider. Use the heat source that your health care provider recommends, such as a moist heat pack or a heating pad. ? Place a towel between your skin and the heat source. ? Leave the heat on for 20-30  minutes. ? Remove the heat if your skin turns bright red. This is especially important if you are unable to feel pain, heat, or cold. You may have a greater risk of getting burned.  If directed, put ice on the affected area. This may help if you are sore or have pain after a cramp or spasm. ? Put ice in a plastic bag. ? Place a towel between your skin and the bag. ? Leavethe ice on for 20 minutes, 2-3 times a day.  Take over-the-counter and prescription medicines only as told by your health care provider.  Pay attention to any changes in your symptoms. Contact a health care provider if:  Your cramps or spasms get more severe or happen more often.  Your cramps or spasms do not improve over time. This information is not intended to replace advice given to you by your health care provider. Make sure you discuss any questions you have with your health care provider. Document Released: 02/15/2002 Document Revised: 09/27/2015 Document Reviewed: 05/30/2015 Elsevier Interactive Patient Education  2018 [place].  Plantar Fasciitis Plantar fasciitis is a painful foot condition that affects the heel. It occurs when the band of tissue that connects the toes to the heel bone (plantar fascia) becomes irritated. This can happen after exercising too much or doing other repetitive activities (overuse injury). The pain from plantar fasciitis can range from mild irritation to severe pain that makes it difficult for you to walk or move. The pain is usually worse in the morning or after you  have been sitting or lying down for a while. What are the causes? This condition may be caused by:  Standing for long periods of time.  Wearing shoes that do not fit.  Doing high-impact activities, including running, aerobics, and ballet.  Being overweight.  Having an abnormal way of walking (gait).  Having tight calf muscles.  Having high arches in your feet.  Starting a new athletic activity.  What are  the signs or symptoms? The main symptom of this condition is heel pain. Other symptoms include:  Pain that gets worse after activity or exercise.  Pain that is worse in the morning or after resting.  Pain that goes away after you walk for a few minutes.  How is this diagnosed? This condition may be diagnosed based on your signs and symptoms. Your health care provider will also do a physical exam to check for:  A tender area on the bottom of your foot.  A high arch in your foot.  Pain when you move your foot.  Difficulty moving your foot.  You may also need to have imaging studies to confirm the diagnosis. These can include:  X-rays.  Ultrasound.  MRI.  How is this treated? Treatment for plantar fasciitis depends on the severity of the condition. Your treatment may include:  Rest, ice, and over-the-counter pain medicines to manage your pain.  Exercises to stretch your calves and your plantar fascia.  A splint that holds your foot in a stretched, upward position while you sleep (night splint).  Physical therapy to relieve symptoms and prevent problems in the future.  Cortisone injections to relieve severe pain.  Extracorporeal shock wave therapy (ESWT) to stimulate damaged plantar fascia with electrical impulses. It is often used as a last resort before surgery.  Surgery, if other treatments have not worked after 12 months.  Follow these instructions at home:  Take medicines only as directed by your health care provider.  Avoid activities that cause pain.  Roll the bottom of your foot over a bag of ice or a bottle of cold water. Do this for 20 minutes, 3-4 times a day.  Perform simple stretches as directed by your health care provider.  Try wearing athletic shoes with air-sole or gel-sole cushions or soft shoe inserts.  Wear a night splint while sleeping, if directed by your health care provider.  Keep all follow-up appointments with your health care  provider. How is this prevented?  Do not perform exercises or activities that cause heel pain.  Consider finding low-impact activities if you continue to have problems.  Lose weight if you need to. The best way to prevent plantar fasciitis is to avoid the activities that aggravate your plantar fascia. Contact a health care provider if:  Your symptoms do not go away after treatment with home care measures.  Your pain gets worse.  Your pain affects your ability to move or do your daily activities. This information is not intended to replace advice given to you by your health care provider. Make sure you discuss any questions you have with your health care provider. Document Released: 05/21/2001 Document Revised: 01/29/2016 Document Reviewed: 07/06/2014 Elsevier Interactive Patient Education  [employer].

## 2018-08-05 NOTE — Progress Notes (Signed)
Pre visit review using our clinic review tool, if applicable. No additional management support is needed unless otherwise documented below in the visit note. 
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
## 2018-08-12 ENCOUNTER — Other Ambulatory Visit: Payer: Self-pay | Admitting: Internal Medicine

## 2018-08-12 ENCOUNTER — Encounter: Payer: Self-pay | Admitting: Internal Medicine

## 2018-08-12 ENCOUNTER — Other Ambulatory Visit (INDEPENDENT_AMBULATORY_CARE_PROVIDER_SITE_OTHER): Payer: BLUE CROSS/BLUE SHIELD

## 2018-08-12 DIAGNOSIS — Z13220 Encounter for screening for lipoid disorders: Secondary | ICD-10-CM | POA: Diagnosis not present

## 2018-08-12 DIAGNOSIS — Z0184 Encounter for antibody response examination: Secondary | ICD-10-CM | POA: Diagnosis not present

## 2018-08-12 DIAGNOSIS — Z0000 Encounter for general adult medical examination without abnormal findings: Secondary | ICD-10-CM

## 2018-08-12 DIAGNOSIS — Z1159 Encounter for screening for other viral diseases: Secondary | ICD-10-CM | POA: Diagnosis not present

## 2018-08-12 DIAGNOSIS — Z1329 Encounter for screening for other suspected endocrine disorder: Secondary | ICD-10-CM

## 2018-08-12 DIAGNOSIS — D649 Anemia, unspecified: Secondary | ICD-10-CM

## 2018-08-12 DIAGNOSIS — R739 Hyperglycemia, unspecified: Secondary | ICD-10-CM

## 2018-08-12 DIAGNOSIS — Z1389 Encounter for screening for other disorder: Secondary | ICD-10-CM

## 2018-08-12 DIAGNOSIS — E559 Vitamin D deficiency, unspecified: Secondary | ICD-10-CM

## 2018-08-12 LAB — CBC WITH DIFFERENTIAL/PLATELET
Basophils Absolute: 0 10*3/uL (ref 0.0–0.1)
Basophils Relative: 0.4 % (ref 0.0–3.0)
Eosinophils Absolute: 0.1 10*3/uL (ref 0.0–0.7)
Eosinophils Relative: 1.6 % (ref 0.0–5.0)
HCT: 36.3 % (ref 36.0–46.0)
Hemoglobin: 11.7 g/dL — ABNORMAL LOW (ref 12.0–15.0)
Lymphocytes Relative: 26.3 % (ref 12.0–46.0)
Lymphs Abs: 1.2 10*3/uL (ref 0.7–4.0)
MCHC: 32.4 g/dL (ref 30.0–36.0)
MCV: 87.1 fl (ref 78.0–100.0)
Monocytes Absolute: 0.4 10*3/uL (ref 0.1–1.0)
Monocytes Relative: 8.2 % (ref 3.0–12.0)
Neutro Abs: 2.8 10*3/uL (ref 1.4–7.7)
Neutrophils Relative %: 63.5 % (ref 43.0–77.0)
Platelets: 295 10*3/uL (ref 150.0–400.0)
RBC: 4.16 Mil/uL (ref 3.87–5.11)
RDW: 15.8 % — ABNORMAL HIGH (ref 11.5–15.5)
WBC: 4.4 10*3/uL (ref 4.0–10.5)

## 2018-08-12 LAB — HEMOGLOBIN A1C: Hgb A1c MFr Bld: 5.9 % (ref 4.6–6.5)

## 2018-08-12 LAB — LIPID PANEL
Cholesterol: 181 mg/dL (ref 0–200)
HDL: 35.4 mg/dL — ABNORMAL LOW (ref >39.00)
LDL Cholesterol: 125 mg/dL — ABNORMAL HIGH (ref 0–99)
NonHDL: 145.58
Total CHOL/HDL Ratio: 5
Triglycerides: 101 mg/dL (ref 0.0–149.0)
VLDL: 20.2 mg/dL (ref 0.0–40.0)

## 2018-08-12 LAB — HEPATIC FUNCTION PANEL
ALT: 9 U/L (ref 0–35)
AST: 11 U/L (ref 0–37)
Albumin: 4.1 g/dL (ref 3.5–5.2)
Alkaline Phosphatase: 78 U/L (ref 39–117)
Bilirubin, Direct: 0.1 mg/dL (ref 0.0–0.3)
Total Bilirubin: 0.4 mg/dL (ref 0.2–1.2)
Total Protein: 6.7 g/dL (ref 6.0–8.3)

## 2018-08-12 LAB — TSH: TSH: 0.68 u[IU]/mL (ref 0.35–4.50)

## 2018-08-12 LAB — VITAMIN D 25 HYDROXY (VIT D DEFICIENCY, FRACTURES): VITD: 11.37 ng/mL — ABNORMAL LOW (ref 30.00–100.00)

## 2018-08-12 LAB — T4, FREE: Free T4: 0.71 ng/dL (ref 0.60–1.60)

## 2018-08-12 MED ORDER — CHOLECALCIFEROL 1.25 MG (50000 UT) PO CAPS: 50000.0000 [IU] | ORAL_CAPSULE | ORAL | 1 refills | Status: DC

## 2018-08-12 NOTE — Addendum Note (Signed)
Addended by: Arby Barrette on: 08/12/2018 09:53 AM   Modules accepted: Orders

## 2018-08-13 DIAGNOSIS — M5442 Lumbago with sciatica, left side: Secondary | ICD-10-CM | POA: Diagnosis not present

## 2018-08-13 DIAGNOSIS — M5441 Lumbago with sciatica, right side: Secondary | ICD-10-CM | POA: Diagnosis not present

## 2018-08-13 DIAGNOSIS — G8929 Other chronic pain: Secondary | ICD-10-CM | POA: Diagnosis not present

## 2018-08-13 LAB — MEASLES/MUMPS/RUBELLA IMMUNITY
Mumps IgG: <9 AU/mL — ABNORMAL LOW
Rubella: 6.11 index
Rubeola IgG: >300 AU/mL

## 2018-08-13 LAB — URINALYSIS, ROUTINE W REFLEX MICROSCOPIC
Bilirubin, UA: NEGATIVE
Glucose, UA: NEGATIVE
Ketones, UA: NEGATIVE
Nitrite, UA: NEGATIVE
Protein, UA: NEGATIVE
Specific Gravity, UA: 1.018 (ref 1.005–1.030)
Urobilinogen, Ur: 0.2 mg/dL (ref 0.2–1.0)
pH, UA: 5 (ref 5.0–7.5)

## 2018-08-13 LAB — MICROSCOPIC EXAMINATION
Bacteria, UA: NONE SEEN
Casts: NONE SEEN /lpf

## 2018-08-13 LAB — IRON,TIBC AND FERRITIN PANEL
%SAT: 15 % (calc) — ABNORMAL LOW (ref 16–45)
Ferritin: 21 ng/mL (ref 16–232)
Iron: 52 ug/dL (ref 40–190)
TIBC: 354 mcg/dL (calc) (ref 250–450)

## 2018-08-17 ENCOUNTER — Telehealth: Payer: Self-pay | Admitting: Internal Medicine

## 2018-08-17 NOTE — Telephone Encounter (Signed)
Copied from [place] [id number]. Topic: Quick Communication - Rx Refill/Question >> Aug 17, 2018  1:23 PM Stacey Roberson, Stacey Roberson wrote: Medication: zolpidem (AMBIEN) 5 MG tablet , cyclobenzaprine (FLEXERIL) 5 MG tablet  Patient want to know if meds can be increased due to not helping  Preferred Pharmacy (with phone number or street name): [id number] [name], [place] [phone] (Phone) [phone] (Fax)

## 2018-08-18 ENCOUNTER — Telehealth: Payer: Self-pay | Admitting: Internal Medicine

## 2018-08-18 DIAGNOSIS — G8929 Other chronic pain: Secondary | ICD-10-CM | POA: Diagnosis not present

## 2018-08-18 DIAGNOSIS — M5441 Lumbago with sciatica, right side: Secondary | ICD-10-CM | POA: Diagnosis not present

## 2018-08-18 DIAGNOSIS — M5442 Lumbago with sciatica, left side: Secondary | ICD-10-CM | POA: Diagnosis not present

## 2018-08-18 NOTE — Telephone Encounter (Signed)
Pt given xray results per notes of Dr Mclean-Scocuzza on 08/10/18. Pt verbalized understanding.

## 2018-08-18 NOTE — Telephone Encounter (Signed)
Please advise 

## 2018-08-19 ENCOUNTER — Telehealth: Payer: Self-pay | Admitting: Internal Medicine

## 2018-08-19 ENCOUNTER — Ambulatory Visit
Admission: RE | Admit: 2018-08-19 | Discharge: 2018-08-19 | Disposition: A | Discharge status: Home / Self Care | Payer: BLUE CROSS/BLUE SHIELD | Source: Ambulatory Visit | Attending: Internal Medicine | Admitting: Internal Medicine

## 2018-08-19 ENCOUNTER — Other Ambulatory Visit: Payer: Self-pay | Admitting: Internal Medicine

## 2018-08-19 ENCOUNTER — Telehealth: Payer: Self-pay | Admitting: *Deleted

## 2018-08-19 DIAGNOSIS — M5416 Radiculopathy, lumbar region: Secondary | ICD-10-CM | POA: Diagnosis not present

## 2018-08-19 DIAGNOSIS — M1288 Other specific arthropathies, not elsewhere classified, other specified site: Secondary | ICD-10-CM | POA: Diagnosis not present

## 2018-08-19 DIAGNOSIS — M545 Low back pain: Secondary | ICD-10-CM | POA: Diagnosis not present

## 2018-08-19 DIAGNOSIS — M62838 Other muscle spasm: Secondary | ICD-10-CM

## 2018-08-19 DIAGNOSIS — M5136 Other intervertebral disc degeneration, lumbar region: Secondary | ICD-10-CM | POA: Diagnosis not present

## 2018-08-19 DIAGNOSIS — G4700 Insomnia, unspecified: Secondary | ICD-10-CM

## 2018-08-19 MED ORDER — CYCLOBENZAPRINE HCL 10 MG PO TABS: 10.0000 mg | ORAL_TABLET | Freq: Every evening | ORAL | 5 refills | Status: DC | PRN

## 2018-08-19 MED ORDER — ZOLPIDEM TARTRATE ER 6.25 MG PO TBCR: 6.2500 mg | EXTENDED_RELEASE_TABLET | Freq: Every evening | ORAL | 0 refills | Status: DC | PRN

## 2018-08-19 NOTE — Telephone Encounter (Signed)
Copied from [place] [id number]. Topic: Quick Communication - Lab Results (Clinic Use ONLY) >> Aug 14, 2018  9:12 AM Babs Bertin, CMA wrote: Called patient to inform them of [phone] lab results. When patient returns call, triage nurse may disclose results.

## 2018-08-19 NOTE — Telephone Encounter (Signed)
Does pt want to see neurology about spasms in thumb?  Also increased ambien to 6.25 mg at night extended release  Increased flexeril to 10 mg at night   Stop taking other doses   TMS

## 2018-08-19 NOTE — Telephone Encounter (Signed)
Copied from [place] [id number]. Topic: General - Inquiry >> Aug 19, 2018 11:46 AM Conception Chancy, NT wrote: Reason for CRM: patient is calling and stating that [facility] is needing a copy of the not that states she is on 2-4 weeks short term disability. She states she does not have a fax machine to fax it.   Fax# [phone]

## 2018-08-19 NOTE — Telephone Encounter (Signed)
Notes have been faxed to [employer]. Electronically.

## 2018-08-19 NOTE — Telephone Encounter (Signed)
Charted in result notes. 

## 2018-08-20 ENCOUNTER — Other Ambulatory Visit: Payer: Self-pay | Admitting: Internal Medicine

## 2018-08-20 DIAGNOSIS — L308 Other specified dermatitis: Secondary | ICD-10-CM | POA: Diagnosis not present

## 2018-08-20 DIAGNOSIS — M7661 Achilles tendinitis, right leg: Secondary | ICD-10-CM | POA: Diagnosis not present

## 2018-08-20 DIAGNOSIS — D489 Neoplasm of uncertain behavior, unspecified: Secondary | ICD-10-CM | POA: Diagnosis not present

## 2018-08-20 DIAGNOSIS — M62838 Other muscle spasm: Secondary | ICD-10-CM

## 2018-08-20 DIAGNOSIS — M5416 Radiculopathy, lumbar region: Secondary | ICD-10-CM

## 2018-08-20 DIAGNOSIS — M7662 Achilles tendinitis, left leg: Secondary | ICD-10-CM | POA: Diagnosis not present

## 2018-08-20 NOTE — Telephone Encounter (Signed)
Please advise 

## 2018-08-20 NOTE — Telephone Encounter (Signed)
Pt states that Dr Allegra Lai is refusing to fill out her short term disability paperwork. He said that it needs to come from who ordered the MRI. They need something stating when the last time she has seen Dr [name], Dr Vickki Muff & Dr Allegra Lai. Please advise.

## 2018-08-20 NOTE — Telephone Encounter (Signed)
Patient was informed.  Patient understood and no questions, comments, or concerns at this time. She is ok with referral.

## 2018-08-26 NOTE — Telephone Encounter (Signed)
Pt states [employer] will be faxing disability paperwork to the office.

## 2018-08-26 NOTE — Telephone Encounter (Signed)
FYI

## 2018-08-27 ENCOUNTER — Telehealth: Payer: Self-pay

## 2018-08-27 NOTE — Telephone Encounter (Signed)
Pt called and stated that she would like [place] to send paperwork to dr Jolyn Nap. Please advise

## 2018-08-27 NOTE — Telephone Encounter (Signed)
Left message for patient to return call back. [facility] may give information.  

## 2018-08-27 NOTE — Telephone Encounter (Signed)
Paperwork already sent

## 2018-08-27 NOTE — Telephone Encounter (Signed)
I have received papers discussed with Dr. Candelaria Stagers and he states he is potentially willing to fill paperwork out you may have to have appt with him and he does not think you need work restrictions   He is a musculoskeletal doctor who needs to fill this paperwork out   [place]

## 2018-08-27 NOTE — Telephone Encounter (Signed)
Additional FMLA/DISABILITY form for [employer] filled out, signature obtained and given to [place] for processing.

## 2018-08-27 NOTE — Telephone Encounter (Signed)
Please advise 

## 2018-09-04 ENCOUNTER — Telehealth: Payer: Self-pay | Admitting: Internal Medicine

## 2018-09-04 NOTE — Telephone Encounter (Signed)
Call [place] [facility] did Dr. Candelaria Stagers fill out patients paperwork for her job which was faxed to him and as we spoke about on 2 occasions?  Again this is a musculoskeletal related issue   Dr. Gayland Curry

## 2018-09-06 NOTE — Telephone Encounter (Signed)
I have not seen this patient nor been involved with her care. (Not sure why the message was forwarded to me.)  I reviewed the chart, and the patient was contacted by staff on 08/28/2018 and asked to provide additional information from her workplace in order for paperwork to be completed.Marland Kitchen

## 2018-09-08 DIAGNOSIS — M25561 Pain in right knee: Secondary | ICD-10-CM | POA: Diagnosis not present

## 2018-09-08 DIAGNOSIS — M5442 Lumbago with sciatica, left side: Secondary | ICD-10-CM | POA: Diagnosis not present

## 2018-09-08 DIAGNOSIS — M5441 Lumbago with sciatica, right side: Secondary | ICD-10-CM | POA: Diagnosis not present

## 2018-09-08 DIAGNOSIS — M47816 Spondylosis without myelopathy or radiculopathy, lumbar region: Secondary | ICD-10-CM | POA: Diagnosis not present

## 2018-09-11 ENCOUNTER — Telehealth: Payer: Self-pay

## 2018-09-11 NOTE — Telephone Encounter (Signed)
Another FMLA/DISABILITY form for [employer] filled out, signature obtained and given to [place] for processing.

## 2018-09-23 ENCOUNTER — Other Ambulatory Visit: Payer: Self-pay | Admitting: Internal Medicine

## 2018-09-23 DIAGNOSIS — G4700 Insomnia, unspecified: Secondary | ICD-10-CM

## 2018-09-23 MED ORDER — ZOLPIDEM TARTRATE ER 6.25 MG PO TBCR: 6.2500 mg | EXTENDED_RELEASE_TABLET | Freq: Every evening | ORAL | 0 refills | Status: DC | PRN

## 2018-09-30 ENCOUNTER — Encounter: Payer: Self-pay | Admitting: Neurology

## 2018-09-30 ENCOUNTER — Ambulatory Visit (INDEPENDENT_AMBULATORY_CARE_PROVIDER_SITE_OTHER): Payer: BLUE CROSS/BLUE SHIELD | Admitting: Neurology

## 2018-09-30 DIAGNOSIS — G5603 Carpal tunnel syndrome, bilateral upper limbs: Secondary | ICD-10-CM | POA: Diagnosis not present

## 2018-09-30 HISTORY — DX: Carpal tunnel syndrome, bilateral upper limbs: G56.03

## 2018-09-30 NOTE — Procedures (Signed)
     HISTORY:  Stacey Roberson is a 48 year old patient with a history of some twitching of the right thumb over the last several months, she has some left shoulder and arm discomfort as well, some tingling into the hands.  The patient is being evaluated for this issue.  NERVE CONDUCTION STUDIES:  Nerve conduction studies were performed on both upper extremities.  The distal motor latencies for the median nerves were prolonged bilaterally with normal motor amplitudes for these nerves bilaterally.  The distal motor latency and motor amplitudes for the right ulnar nerve were normal.  The nerve conduction velocities for the median nerves bilaterally and for the right ulnar nerve were normal.  The sensory latencies for the median nerves were prolonged bilaterally, normal for the right ulnar nerve.  The right ulnar F-wave latency was normal.  EMG STUDIES:  EMG study was performed on the left upper extremity:  The first dorsal interosseous muscle reveals 2 to 4 K units with full recruitment. No fibrillations or positive waves were noted. The abductor pollicis brevis muscle reveals 2 to 4 K units with full recruitment. No fibrillations or positive waves were noted. The extensor indicis proprius muscle reveals 1 to 3 K units with full recruitment. No fibrillations or positive waves were noted. The pronator teres muscle reveals 2 to 3 K units with full recruitment. No fibrillations or positive waves were noted. The biceps muscle reveals 2 to 4 K units with decreased recruitment. No fibrillations or positive waves were noted. The triceps muscle reveals 2 to 4 K units with full recruitment. No fibrillations or positive waves were noted. The anterior deltoid muscle reveals 2 to 5 K units with decreased recruitment. No fibrillations or positive waves were noted. The cervical paraspinal muscles were tested at 2 levels. No abnormalities of insertional activity were seen at either level tested. There was good  relaxation.   IMPRESSION:  Nerve conduction studies were performed on both upper extremities.  These studies showed evidence of mild bilateral carpal tunnel syndrome.  EMG evaluation of the left upper extremity shows evidence of a chronic stable C5 radiculopathy, possibly with involvement of the C6 level as well.  Jill Alexanders MD 09/30/2018 4:31 PM  [facility] [address] [place], [place] [address]  Phone [phone] Fax [phone]

## 2018-09-30 NOTE — Progress Notes (Signed)
[place]    Nerve / Sites Muscle Latency Ref. Amplitude Ref. Rel Amp Segments Distance Velocity Ref. Area    ms ms mV mV %  cm m/s m/s mVms  R Median - APB     Wrist APB 4.1 ?4.4 5.8 ?4.0 100 Wrist - APB 7   19.3     Upper arm APB 8.6  5.7  99.2 Upper arm - Wrist 22 49 ?49 21.7  L Median - APB     Wrist APB 4.3 ?4.4 8.2 ?4.0 100 Wrist - APB 7   29.6     Upper arm APB 8.7  8.2  98.9 Upper arm - Wrist 22 50 ?49 31.8  R Ulnar - ADM     Wrist ADM 2.9 ?3.3 13.1 ?6.0 100 Wrist - ADM 7   43.7     B.Elbow ADM 6.3  10.3  78.4 B.Elbow - Wrist 18 54 ?49 34.9     A.Elbow ADM 8.1  10.0  97.4 A.Elbow - B.Elbow 10 55 ?49 35.4         A.Elbow - Wrist               SNC    Nerve / Sites Rec. Site Peak Lat Ref.  Amp Ref. Segments Distance    ms ms V V  cm  R Median - Orthodromic (Dig II, Mid palm)     Dig II Wrist 4.2 ?3.4 8 ?10 Dig II - Wrist 13  L Median - Orthodromic (Dig II, Mid palm)     Dig II Wrist 4.1 ?3.4 8 ?10 Dig II - Wrist 13  R Ulnar - Orthodromic, (Dig V, Mid palm)     Dig V Wrist 3.1 ?3.1 10 ?5 Dig V - Wrist 63           F  Wave    Nerve F Lat Ref.   ms ms  R Ulnar - ADM 26.7 ?32.0

## 2018-10-22 ENCOUNTER — Ambulatory Visit: Payer: BLUE CROSS/BLUE SHIELD | Admitting: Internal Medicine

## 2018-10-22 DIAGNOSIS — Z0289 Encounter for other administrative examinations: Secondary | ICD-10-CM

## 2018-10-23 ENCOUNTER — Ambulatory Visit: Payer: BLUE CROSS/BLUE SHIELD | Admitting: Family Medicine

## 2018-10-26 ENCOUNTER — Ambulatory Visit: Payer: BLUE CROSS/BLUE SHIELD | Admitting: Family Medicine

## 2018-10-27 ENCOUNTER — Ambulatory Visit (INDEPENDENT_AMBULATORY_CARE_PROVIDER_SITE_OTHER): Payer: BLUE CROSS/BLUE SHIELD | Admitting: Internal Medicine

## 2018-10-27 ENCOUNTER — Encounter: Payer: Self-pay | Admitting: Internal Medicine

## 2018-10-27 VITALS — BP 106/76 | HR 97 | Temp 98.6°F | Ht 64.0 in | Wt 208.0 lb

## 2018-10-27 DIAGNOSIS — G5603 Carpal tunnel syndrome, bilateral upper limbs: Secondary | ICD-10-CM

## 2018-10-27 DIAGNOSIS — G4700 Insomnia, unspecified: Secondary | ICD-10-CM

## 2018-10-27 DIAGNOSIS — M5412 Radiculopathy, cervical region: Secondary | ICD-10-CM

## 2018-10-27 DIAGNOSIS — M5416 Radiculopathy, lumbar region: Secondary | ICD-10-CM

## 2018-10-27 DIAGNOSIS — J329 Chronic sinusitis, unspecified: Secondary | ICD-10-CM

## 2018-10-27 DIAGNOSIS — R6889 Other general symptoms and signs: Secondary | ICD-10-CM

## 2018-10-27 DIAGNOSIS — J029 Acute pharyngitis, unspecified: Secondary | ICD-10-CM

## 2018-10-27 LAB — POC INFLUENZA A&B (BINAX/QUICKVUE)
Influenza A, POC: NEGATIVE
Influenza B, POC: NEGATIVE

## 2018-10-27 LAB — POCT RAPID STREP A (OFFICE): Rapid Strep A Screen: NEGATIVE

## 2018-10-27 MED ORDER — CETIRIZINE HCL 10 MG PO TABS: 10.0000 mg | ORAL_TABLET | Freq: Every day | ORAL | 3 refills | Status: DC

## 2018-10-27 MED ORDER — ZOLPIDEM TARTRATE ER 6.25 MG PO TBCR: 6.2500 mg | EXTENDED_RELEASE_TABLET | Freq: Every evening | ORAL | 2 refills | Status: DC | PRN

## 2018-10-27 MED ORDER — AMOXICILLIN-POT CLAVULANATE 875-125 MG PO TABS: 1.0000 | ORAL_TABLET | Freq: Two times a day (BID) | ORAL | 0 refills | Status: DC

## 2018-10-27 NOTE — Progress Notes (Signed)
Pre visit review using our clinic review tool, if applicable. No additional management support is needed unless otherwise documented below in the visit note. 
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
## 2018-10-27 NOTE — Patient Instructions (Signed)
Carpal Tunnel Syndrome  Carpal tunnel syndrome is a condition that causes pain in your hand and arm. The carpal tunnel is a narrow area located on the palm side of your wrist. Repeated wrist motion or certain diseases may cause swelling within the tunnel. This swelling pinches the main nerve in the wrist (median nerve). What are the causes? This condition may be caused by:  Repeated wrist motions.  Wrist injuries.  Arthritis.  A cyst or tumor in the carpal tunnel.  Fluid buildup during pregnancy. Sometimes the cause of this condition is not known. What increases the risk? The following factors may make you more likely to develop this condition:  Having a job, such as being a butcher or a cashier, that requires you to repeatedly move your wrist in the same motion.  Being a woman.  Having certain conditions, such as: ? Diabetes. ? Obesity. ? An underactive thyroid (hypothyroidism). ? Kidney failure. What are the signs or symptoms? Symptoms of this condition include:  A tingling feeling in your fingers, especially in your thumb, index, and middle fingers.  Tingling or numbness in your hand.  An aching feeling in your entire arm, especially when your wrist and elbow are bent for a long time.  Wrist pain that goes up your arm to your shoulder.  Pain that goes down into your palm or fingers.  A weak feeling in your hands. You may have trouble grabbing and holding items. Your symptoms may feel worse during the night. How is this diagnosed? This condition is diagnosed with a medical history and physical exam. You may also have tests, including:  Electromyogram (EMG). This test measures electrical signals sent by your nerves into the muscles.  Nerve conduction study. This test measures how well electrical signals pass through your nerves.  Imaging tests, such as X-rays, ultrasound, and MRI. These tests check for possible causes of your condition. How is this treated? This  condition may be treated with:  Lifestyle changes. It is important to stop or change the activity that caused your condition.  Doing exercise and activities to strengthen your muscles and bones (physical therapy).  Learning how to use your hand again after diagnosis (occupational therapy).  Medicines for pain and inflammation. This may include medicine that is injected into your wrist.  A wrist splint.  Surgery. Follow these instructions at home: If you have a splint:  Wear the splint as told by your health care provider. Remove it only as told by your health care provider.  Loosen the splint if your fingers tingle, become numb, or turn cold and blue.  Keep the splint clean.  If the splint is not waterproof: ? Do not let it get wet. ? Cover it with a watertight covering when you take a bath or shower. Managing pain, stiffness, and swelling   If directed, put ice on the painful area: ? If you have a removable splint, remove it as told by your health care provider. ? Put ice in a plastic bag. ? Place a towel between your skin and the bag. ? Leave the ice on for 20 minutes, 2-3 times per day. General instructions  Take over-the-counter and prescription medicines only as told by your health care provider.  Rest your wrist from any activity that may be causing your pain. If your condition is work related, talk with your employer about changes that can be made, such as getting a wrist pad to use while typing.  Do any exercises as told   by your health care provider, physical therapist, or occupational therapist.  Keep all follow-up visits as told by your health care provider. This is important. Contact a health care provider if:  You have new symptoms.  Your pain is not controlled with medicines.  Your symptoms get worse. Get help right away if:  You have severe numbness or tingling in your wrist or hand. Summary  Carpal tunnel syndrome is a condition that causes pain in  your hand and arm.  It is usually caused by repeated wrist motions.  Lifestyle changes and medicines are used to treat carpal tunnel syndrome. Surgery may be recommended.  Follow your health care provider's instructions about wearing a splint, resting from activity, keeping follow-up visits, and calling for help. This information is not intended to replace advice given to you by your health care provider. Make sure you discuss any questions you have with your health care provider. Document Released: 08/23/2000 Document Revised: 01/02/2018 Document Reviewed: 01/02/2018 Elsevier Interactive Patient Education  2019 [employer]  Cervical Radiculopathy  Cervical radiculopathy happens when a nerve in the neck (cervical nerve) is pinched or bruised. This condition can develop because of an injury or as part of the normal aging process. Pressure on the cervical nerves can cause pain or numbness that runs from the neck all the way down into the arm and fingers. Usually, this condition gets better with rest. Treatment may be needed if the condition does not improve. What are the causes? This condition may be caused by:  Injury.  Slipped (herniated) disk.  Muscle tightness in the neck because of overuse.  Arthritis.  Breakdown or degeneration in the bones and joints of the spine (spondylosis) due to aging.  Bone spurs that may develop near the cervical nerves. What are the signs or symptoms? Symptoms of this condition include:  Pain that runs from the neck to the arm and hand. The pain can be severe or irritating. It may be worse when the neck is moved.  Numbness or weakness in the affected arm and hand. How is this diagnosed? This condition may be diagnosed based on symptoms, medical history, and a physical exam. You may also have tests, including:  X-rays.  CT scan.  MRI.  Electromyogram (EMG).  Nerve conduction tests. How is this treated? In many cases, treatment is not  needed for this condition. With rest, the condition usually gets better over time. If treatment is needed, options may include:  Wearing a soft neck collar for short periods of time.  Physical therapy to strengthen your neck muscles.  Medicines, such as NSAIDs, oral corticosteroids, or spinal injections.  Surgery. This may be needed if other treatments do not help. Various types of surgery may be done depending on the cause of your problems. Follow these instructions at home: Managing pain  Take over-the-counter and prescription medicines only as told by your health care provider.  If directed, apply ice to the affected area. ? Put ice in a plastic bag. ? Place a towel between your skin and the bag. ? Leave the ice on for 20 minutes, 2-3 times per day.  If ice does not help, you can try using heat. Take a warm shower or warm bath, or use a heat pack as told by your health care provider.  Try a gentle neck and shoulder massage to help relieve symptoms. Activity  Rest as needed. Follow instructions from your health care provider about any restrictions on activities.  Do stretching and strengthening  exercises as told by your health care provider or physical therapist. General instructions  If you were given a soft collar, wear it as told by your health care provider.  Use a flat pillow when you sleep.  Keep all follow-up visits as told by your health care provider. This is important. Contact a health care provider if:  Your condition does not improve with treatment. Get help right away if:  Your pain gets much worse and cannot be controlled with medicines.  You have weakness or numbness in your hand, arm, face, or leg.  You have a high fever.  You have a stiff, rigid neck.  You lose control of your bowels or your bladder (have incontinence).  You have trouble with walking, balance, or speaking. This information is not intended to replace advice given to you by your  health care provider. Make sure you discuss any questions you have with your health care provider. Document Released: 05/21/2001 Document Revised: 02/01/2016 Document Reviewed: 10/20/2014 Elsevier Interactive Patient Education  [employer].

## 2018-10-30 ENCOUNTER — Encounter: Payer: Self-pay | Admitting: Internal Medicine

## 2018-10-30 ENCOUNTER — Telehealth: Payer: Self-pay | Admitting: Internal Medicine

## 2018-10-30 DIAGNOSIS — M5412 Radiculopathy, cervical region: Secondary | ICD-10-CM | POA: Insufficient documentation

## 2018-10-30 NOTE — Telephone Encounter (Signed)
cAll pt she needs to call [facility] and schedule mammogram   [place]

## 2018-11-02 ENCOUNTER — Ambulatory Visit
Admission: RE | Admit: 2018-11-02 | Discharge: 2018-11-02 | Disposition: A | Discharge status: Home / Self Care | Payer: BLUE CROSS/BLUE SHIELD | Source: Ambulatory Visit | Attending: Internal Medicine | Admitting: Internal Medicine

## 2018-11-02 DIAGNOSIS — M5412 Radiculopathy, cervical region: Secondary | ICD-10-CM | POA: Insufficient documentation

## 2018-11-02 DIAGNOSIS — M542 Cervicalgia: Secondary | ICD-10-CM | POA: Diagnosis not present

## 2018-11-02 NOTE — Telephone Encounter (Signed)
Left message for patient to return call back. [facility] may give information.  

## 2018-11-02 NOTE — Progress Notes (Signed)
Tomorrow Feb 25

## 2018-11-03 ENCOUNTER — Ambulatory Visit: Payer: BLUE CROSS/BLUE SHIELD | Admitting: Internal Medicine

## 2018-11-03 ENCOUNTER — Other Ambulatory Visit: Payer: Self-pay | Admitting: Internal Medicine

## 2018-11-03 DIAGNOSIS — R21 Rash and other nonspecific skin eruption: Secondary | ICD-10-CM

## 2018-11-03 DIAGNOSIS — M5412 Radiculopathy, cervical region: Secondary | ICD-10-CM | POA: Diagnosis not present

## 2018-11-03 DIAGNOSIS — M25512 Pain in left shoulder: Secondary | ICD-10-CM | POA: Diagnosis not present

## 2018-11-03 DIAGNOSIS — G8929 Other chronic pain: Secondary | ICD-10-CM | POA: Diagnosis not present

## 2018-11-03 DIAGNOSIS — M5030 Other cervical disc degeneration, unspecified cervical region: Secondary | ICD-10-CM | POA: Diagnosis not present

## 2018-11-03 MED ORDER — TRIAMCINOLONE ACETONIDE 0.1 % EX CREA: 1.0000 "application " | TOPICAL_CREAM | Freq: Two times a day (BID) | CUTANEOUS | 0 refills | Status: DC

## 2018-11-05 ENCOUNTER — Encounter: Payer: Self-pay | Admitting: Internal Medicine

## 2018-11-05 ENCOUNTER — Ambulatory Visit (INDEPENDENT_AMBULATORY_CARE_PROVIDER_SITE_OTHER): Payer: BLUE CROSS/BLUE SHIELD | Admitting: Internal Medicine

## 2018-11-05 VITALS — BP 122/68 | HR 86 | Temp 98.4°F | Ht 64.0 in | Wt 203.6 lb

## 2018-11-05 DIAGNOSIS — H6993 Unspecified Eustachian tube disorder, bilateral: Secondary | ICD-10-CM

## 2018-11-05 DIAGNOSIS — R05 Cough: Secondary | ICD-10-CM

## 2018-11-05 DIAGNOSIS — R350 Frequency of micturition: Secondary | ICD-10-CM

## 2018-11-05 DIAGNOSIS — J029 Acute pharyngitis, unspecified: Secondary | ICD-10-CM

## 2018-11-05 DIAGNOSIS — R319 Hematuria, unspecified: Secondary | ICD-10-CM

## 2018-11-05 DIAGNOSIS — R062 Wheezing: Secondary | ICD-10-CM

## 2018-11-05 DIAGNOSIS — R059 Cough, unspecified: Secondary | ICD-10-CM

## 2018-11-05 MED ORDER — ALBUTEROL SULFATE (2.5 MG/3ML) 0.083% IN NEBU
2.5000 mg | INHALATION_SOLUTION | Freq: Once | RESPIRATORY_TRACT | Status: AC
  Administered 2018-11-05: 2.5 mg via RESPIRATORY_TRACT

## 2018-11-05 MED ORDER — PSEUDOEPHEDRINE HCL ER 120 MG PO TB12: 120.0000 mg | ORAL_TABLET | Freq: Two times a day (BID) | ORAL | 0 refills | Status: DC | PRN

## 2018-11-05 MED ORDER — LEVOFLOXACIN 750 MG PO TABS: 750.0000 mg | ORAL_TABLET | Freq: Every day | ORAL | 0 refills | Status: DC

## 2018-11-05 MED ORDER — IPRATROPIUM BROMIDE 0.02 % IN SOLN
0.5000 mg | Freq: Once | RESPIRATORY_TRACT | Status: AC
  Administered 2018-11-05: 0.5 mg via RESPIRATORY_TRACT

## 2018-11-05 NOTE — Progress Notes (Signed)
Pre visit review using our clinic review tool, if applicable. No additional management support is needed unless otherwise documented below in the visit note. 

## 2018-11-05 NOTE — Patient Instructions (Addendum)
Sudafed x 3 days or less  Take levaquin 750 mg x 5 days with food  Call back if not better    Hematuria, Adult Hematuria is blood in the urine. Blood may be visible in the urine, or it may be identified with a test. This condition can be caused by infections of the bladder, urethra, kidney, or prostate. Other possible causes include:  Kidney stones.  Cancer of the urinary tract.  Too much calcium in the urine.  Conditions that are passed from parent to child (inherited conditions).  Exercise that requires a lot of energy. Infections can usually be treated with medicine, and a kidney stone usually will pass through your urine. If neither of these is the cause of your hematuria, more tests may be needed to identify the cause of your symptoms. It is very important to tell your health care provider about any blood in your urine, even if it is painless or the blood stops without treatment. Blood in the urine, when it happens and then stops and then happens again, can be a symptom of a very serious condition, including cancer. There is no pain in the initial stages of many urinary cancers. Follow these instructions at home: Medicines  Take over-the-counter and prescription medicines only as told by your health care provider.  If you were prescribed an antibiotic medicine, take it as told by your health care provider. Do not stop taking the antibiotic even if you start to feel better. Eating and drinking  Drink enough fluid to keep your urine clear or pale yellow. It is recommended that you drink 3-4 quarts (2.8-3.8 L) a day. If you have been diagnosed with an infection, it is recommended that you drink cranberry juice in addition to large amounts of water.  Avoid caffeine, tea, and carbonated beverages. These tend to irritate the bladder.  Avoid alcohol because it may irritate the prostate (men). General instructions  If you have been diagnosed with a kidney stone, follow your health care  provider's instructions about straining your urine to catch the stone.  Empty your bladder often. Avoid holding urine for long periods of time.  If you are female: ? After a bowel movement, wipe from front to back and use each piece of toilet paper only once. ? Empty your bladder before and after sex.  Pay attention to any changes in your symptoms. Tell your health care provider about any changes or any new symptoms.  It is your responsibility to get your test results. Ask your health care provider, or the department performing the test, when your results will be ready.  Keep all follow-up visits as told by your health care provider. This is important. Contact a health care provider if:  You develop back pain.  You have a fever.  You have nausea or vomiting.  Your symptoms do not improve after 3 days.  Your symptoms get worse. Get help right away if:  You develop severe vomiting and are unable take medicine without vomiting.  You develop severe pain in your back or abdomen even though you are taking medicine.  You pass a large amount of blood in your urine.  You pass blood clots in your urine.  You feel very weak or like you might faint.  You faint. Summary  Hematuria is blood in the urine. It has many possible causes.  It is very important that you tell your health care provider about any blood in your urine, even if it is painless or  the blood stops without treatment.  Take over-the-counter and prescription medicines only as told by your health care provider.  Drink enough fluid to keep your urine clear or pale yellow. This information is not intended to replace advice given to you by your health care provider. Make sure you discuss any questions you have with your health care provider. Document Released: 08/26/2005 Document Revised: 09/28/2016 Document Reviewed: 09/28/2016 Elsevier Interactive Patient Education  2019 [employer].    Eustachian Tube  Dysfunction  Eustachian tube dysfunction refers to a condition in which a blockage develops in the narrow passage that connects the middle ear to the back of the nose (eustachian tube). The eustachian tube regulates air pressure in the middle ear by letting air move between the ear and nose. It also helps to drain fluid from the middle ear space. Eustachian tube dysfunction can affect one or both ears. When the eustachian tube does not function properly, air pressure, fluid, or both can build up in the middle ear. What are the causes? This condition occurs when the eustachian tube becomes blocked or cannot open normally. Common causes of this condition include:  Ear infections.  Colds and other infections that affect the nose, mouth, and throat (upper respiratory tract).  Allergies.  Irritation from cigarette smoke.  Irritation from stomach acid coming up into the esophagus (gastroesophageal reflux). The esophagus is the tube that carries food from the mouth to the stomach.  Sudden changes in air pressure, such as from descending in an airplane or scuba diving.  Abnormal growths in the nose or throat, such as: ? Growths that line the nose (nasal polyps). ? Abnormal growth of cells (tumors). ? Enlarged tissue at the back of the throat (adenoids). What increases the risk? You are more likely to develop this condition if:  You smoke.  You are overweight.  You are a child who has: ? Certain birth defects of the mouth, such as cleft palate. ? Large tonsils or adenoids. What are the signs or symptoms? Common symptoms of this condition include:  A feeling of fullness in the ear.  Ear pain.  Clicking or popping noises in the ear.  Ringing in the ear.  Hearing loss.  Loss of balance.  Dizziness. Symptoms may get worse when the air pressure around you changes, such as when you travel to an area of high elevation, fly on an airplane, or go scuba diving. How is this  diagnosed? This condition may be diagnosed based on:  Your symptoms.  A physical exam of your ears, nose, and throat.  Tests, such as those that measure: ? The movement of your eardrum (tympanogram). ? Your hearing (audiometry). How is this treated? Treatment depends on the cause and severity of your condition.  In mild cases, you may relieve your symptoms by moving air into your ears. This is called "popping the ears."  In more severe cases, or if you have symptoms of fluid in your ears, treatment may include: ? Medicines to relieve congestion (decongestants). ? Medicines that treat allergies (antihistamines). ? Nasal sprays or ear drops that contain medicines that reduce swelling (steroids). ? A procedure to drain the fluid in your eardrum (myringotomy). In this procedure, a small tube is placed in the eardrum to:  Drain the fluid.  Restore the air in the middle ear space. ? A procedure to insert a balloon device through the nose to inflate the opening of the eustachian tube (balloon dilation). Follow these instructions at home: Lifestyle  Do not do any of the following until your health care provider approves: ? Travel to high altitudes. ? Fly in airplanes. ? Work in a [employer] scheme manager or room. ? Scuba dive.  Do not use any products that contain nicotine or tobacco, such as cigarettes and e-cigarettes. If you need help quitting, ask your health care provider.  Keep your ears dry. Wear fitted earplugs during showering and bathing. Dry your ears completely after. General instructions  Take over-the-counter and prescription medicines only as told by your health care provider.  Use techniques to help pop your ears as recommended by your health care provider. These may include: ? Chewing gum. ? Yawning. ? Frequent, forceful swallowing. ? Closing your mouth, holding your nose closed, and gently blowing as if you are trying to blow air out of your nose.  Keep all follow-up  visits as told by your health care provider. This is important. Contact a health care provider if:  Your symptoms do not go away after treatment.  Your symptoms come back after treatment.  You are unable to pop your ears.  You have: ? A fever. ? Pain in your ear. ? Pain in your head or neck. ? Fluid draining from your ear.  Your hearing suddenly changes.  You become very dizzy.  You lose your balance. Summary  Eustachian tube dysfunction refers to a condition in which a blockage develops in the eustachian tube.  It can be caused by ear infections, allergies, inhaled irritants, or abnormal growths in the nose or throat.  Symptoms include ear pain, hearing loss, or ringing in the ears.  Mild cases are treated with maneuvers to unblock the ears, such as yawning or ear popping.  Severe cases are treated with medicines. Surgery may also be done (rare). This information is not intended to replace advice given to you by your health care provider. Make sure you discuss any questions you have with your health care provider. Document Released: 09/22/2015 Document Revised: 12/16/2017 Document Reviewed: 12/16/2017 Elsevier Interactive Patient Education  2019 [employer].
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
## 2018-11-05 NOTE — Addendum Note (Signed)
Addended byElpidio Galea T on: 11/05/2018 02:25 PM   Modules accepted: Orders

## 2018-11-06 LAB — URINE CULTURE
MICRO NUMBER:: 251171
SPECIMEN QUALITY:: ADEQUATE

## 2018-11-06 LAB — URINALYSIS, ROUTINE W REFLEX MICROSCOPIC
Bacteria, UA: NONE SEEN /HPF
Bilirubin Urine: NEGATIVE
Glucose, UA: NEGATIVE
Hyaline Cast: NONE SEEN /LPF
Ketones, ur: NEGATIVE
Nitrite: NEGATIVE
Specific Gravity, Urine: 1.023 (ref 1.001–1.03)
pH: 6 (ref 5.0–8.0)

## 2018-11-09 ENCOUNTER — Other Ambulatory Visit: Payer: Self-pay | Admitting: Internal Medicine

## 2018-11-09 DIAGNOSIS — R319 Hematuria, unspecified: Secondary | ICD-10-CM

## 2018-11-10 DIAGNOSIS — M5412 Radiculopathy, cervical region: Secondary | ICD-10-CM | POA: Diagnosis not present

## 2018-11-10 DIAGNOSIS — M5030 Other cervical disc degeneration, unspecified cervical region: Secondary | ICD-10-CM | POA: Diagnosis not present

## 2018-11-10 DIAGNOSIS — M25512 Pain in left shoulder: Secondary | ICD-10-CM | POA: Diagnosis not present

## 2018-11-10 DIAGNOSIS — G8929 Other chronic pain: Secondary | ICD-10-CM | POA: Diagnosis not present

## 2018-11-23 ENCOUNTER — Ambulatory Visit: Payer: BLUE CROSS/BLUE SHIELD

## 2018-12-23 ENCOUNTER — Ambulatory Visit: Payer: BLUE CROSS/BLUE SHIELD | Attending: Internal Medicine

## 2019-02-18 ENCOUNTER — Other Ambulatory Visit: Payer: Self-pay | Admitting: Internal Medicine

## 2019-02-18 DIAGNOSIS — F419 Anxiety disorder, unspecified: Secondary | ICD-10-CM

## 2019-02-18 DIAGNOSIS — M62838 Other muscle spasm: Secondary | ICD-10-CM

## 2019-02-18 DIAGNOSIS — M5416 Radiculopathy, lumbar region: Secondary | ICD-10-CM

## 2019-02-18 DIAGNOSIS — F329 Major depressive disorder, single episode, unspecified: Secondary | ICD-10-CM

## 2019-02-18 DIAGNOSIS — F32A Depression, unspecified: Secondary | ICD-10-CM

## 2019-02-18 MED ORDER — CYCLOBENZAPRINE HCL 10 MG PO TABS: 10.0000 mg | ORAL_TABLET | Freq: Every evening | ORAL | 5 refills | Status: DC | PRN

## 2019-02-18 MED ORDER — SERTRALINE HCL 100 MG PO TABS: 150.0000 mg | ORAL_TABLET | Freq: Every day | ORAL | 11 refills | Status: DC

## 2019-03-10 ENCOUNTER — Other Ambulatory Visit: Payer: Self-pay | Admitting: Internal Medicine

## 2019-03-10 DIAGNOSIS — G4700 Insomnia, unspecified: Secondary | ICD-10-CM

## 2019-03-10 MED ORDER — ZOLPIDEM TARTRATE ER 6.25 MG PO TBCR: 6.2500 mg | EXTENDED_RELEASE_TABLET | Freq: Every evening | ORAL | 2 refills | Status: DC | PRN

## 2019-03-16 DIAGNOSIS — M5412 Radiculopathy, cervical region: Secondary | ICD-10-CM | POA: Diagnosis not present

## 2019-03-16 DIAGNOSIS — M5030 Other cervical disc degeneration, unspecified cervical region: Secondary | ICD-10-CM | POA: Diagnosis not present

## 2019-03-16 DIAGNOSIS — M62830 Muscle spasm of back: Secondary | ICD-10-CM | POA: Diagnosis not present

## 2019-03-16 DIAGNOSIS — M25512 Pain in left shoulder: Secondary | ICD-10-CM | POA: Diagnosis not present

## 2019-04-14 ENCOUNTER — Telehealth: Payer: Self-pay

## 2019-04-14 NOTE — Telephone Encounter (Signed)
I do not know the information needed.

## 2019-04-14 NOTE — Telephone Encounter (Signed)
Copied from [place] [id number]. Topic: General - Other >> Apr 14, 2019  1:41 PM Sheran Luz wrote: Patient would like to know the steps of getting "set up" with social security. Patient is requesting call back from [place].

## 2019-04-14 NOTE — Telephone Encounter (Signed)
Unable to leave message for patient to return call back, mail bxmis full. PEC maygive results and obtain information.

## 2019-04-14 NOTE — Telephone Encounter (Signed)
Go to the social security office for help or contact her social worker if she has that option on insurance card listed

## 2019-05-07 ENCOUNTER — Ambulatory Visit: Payer: Self-pay

## 2019-05-07 NOTE — Telephone Encounter (Signed)
Incoming call from Patient with complaint of lower back Pain.  For about 2 weeks Thinks it is possibly an  UTI Patient states that it maybe a UTI.  .  Pain does not radiate.  Marland Kitchen  Has been taking tylenol and Ibuprofen with   Relief Patient reports urgency .  Denies fever.   Reviewed  Protocol and provided care advice. No appointments available  Until Monday .  Patient would like an appointment for Monday.  Will relate to office.  Gave the option off going to Urgent care. prefers  To come in office on Monday.  Patient awaits return call to [phone]           Reason for Disposition . [1] MODERATE back pain (e.g., interferes with normal activities) AND [2] present > 3 days  Answer Assessment - Initial Assessment Questions 1. ONSET: "When did the pain begin?"       2 weekw 2. LOCATION: "Where does it hurt?" (upper, mid or lower back)     Lower back 3. SEVERITY: "How bad is the pain?"  (e.g., Scale 1-10; mild, moderate, or severe)   - MILD (1-3): doesn't interfere with normal activities    - MODERATE (4-7): interferes with normal activities or awakens from sleep    - SEVERE (8-10): excruciating pain, unable to do any normal activities      moderate 4. PATTERN: "Is the pain constant?" (e.g., yes, no; constant, intermittent)      constant 5. RADIATION: "Does the pain shoot into your legs or elsewhere?"     denies 6. CAUSE:  "What do you think is causing the back pain?"      *No Answer* 7. BACK OVERUSE:  "Any recent lifting of heavy objects, strenuous work or exercise?"     denies 8. MEDICATIONS: "What have you taken so far for the pain?" (e.g., nothing, acetaminophen, NSAIDS)     tyleno ibuphren 9. NEUROLOGIC SYMPTOMS: "Do you have any weakness, numbness, or problems with bowel/bladder control?"     Urine more frquentky 10. OTHER SYMPTOMS: "Do you have any other symptoms?" (e.g., fever, abdominal pain, burning with urination, blood in urine)       denies 11. PREGNANCY: "Is there any  chance you are pregnant?" (e.g., yes, no; LMP)      na  Protocols used: BACK PAIN-A-AH

## 2019-05-07 NOTE — Telephone Encounter (Signed)
Can you give patient a letter for her job on Monday when you see her for an evaluation please?   Scheduled patient with NP ( lauren) for Monday patient refused UC today no appointments available today , patient asking for note for work that's she has UTI symptoms. Lower back pain with frequency of urination X 2 weeks  Thanks tMS

## 2019-05-07 NOTE — Telephone Encounter (Signed)
No appointments available in office recommended UC.

## 2019-05-07 NOTE — Telephone Encounter (Signed)
Scheduled patient with NP ( lauren) for Monday patient refused UC today no appointments available today , patient asking for note for work that's she has UTI symptoms. Lower back pain with frequency of urination X 2 weeks.

## 2019-05-10 ENCOUNTER — Other Ambulatory Visit: Payer: Self-pay

## 2019-05-10 ENCOUNTER — Ambulatory Visit: Payer: BLUE CROSS/BLUE SHIELD | Admitting: Family Medicine

## 2019-05-10 ENCOUNTER — Ambulatory Visit (INDEPENDENT_AMBULATORY_CARE_PROVIDER_SITE_OTHER): Payer: BC Managed Care – PPO | Admitting: Family Medicine

## 2019-05-10 ENCOUNTER — Telehealth: Payer: Self-pay | Admitting: Internal Medicine

## 2019-05-10 DIAGNOSIS — M5416 Radiculopathy, lumbar region: Secondary | ICD-10-CM

## 2019-05-10 DIAGNOSIS — N390 Urinary tract infection, site not specified: Secondary | ICD-10-CM

## 2019-05-10 MED ORDER — SULFAMETHOXAZOLE-TRIMETHOPRIM 800-160 MG PO TABS: 1.0000 | ORAL_TABLET | Freq: Two times a day (BID) | ORAL | 0 refills | Status: DC

## 2019-05-10 NOTE — Telephone Encounter (Signed)
We have written her letters 08/05/18 and 2 on 10/30/18  -what does she need the letters to state and why does she need a letter?   If her back is still bothering her she needs to f/u with a specialist -does she need referral?   [place]

## 2019-05-10 NOTE — Telephone Encounter (Signed)
Not sure I did not evaluate this patient she had an appt Monday today for this I thought (acute visit) but if for back issues will further investigate let me know but she was out of work and I assumed for acute visit today? Is this the case?    Stacey Roberson

## 2019-05-10 NOTE — Telephone Encounter (Signed)
No she said the note was in regard to her back pain x1 year
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
## 2019-05-10 NOTE — Telephone Encounter (Signed)
Patient has not worked since 05/17/2018 due to back pain related issues  I saw her today for UTI symptoms  I am not sure I am the right person to write her a work note for her job stemming from back issues from a year ago - this sounds like long term disability  She does see physical medicine at [facility] for her back issues also  LGuse FNP

## 2019-05-10 NOTE — Telephone Encounter (Signed)
I have not advised her to be out of work since 05/2018 for her back and if she has really been out of work this long she needs to tell me a good reason why as I have not excused this -She was initially out of work 05/19/18 due hysterectomy and then for musculskeletal pain Dr. Candelaria Stagers was supposed to fax paper work to her job back in 08/2018   What is she needing from me as far as a letter?   Does she need another work PT/OT evaluation to return to work?

## 2019-05-12 NOTE — Telephone Encounter (Signed)
See nurse triage note from 05/07/19.

## 2019-05-12 NOTE — Telephone Encounter (Signed)
Called and spoke to patient.  Patient said that she has been out of work since 05/18/18 and said that Dr. [name] has written her work notes to be out of work and has sent in papers to short-term disability multiple times but have been denied.  Patient said that she was unable to pass a physical evaluation that was given through her job.  Patient said that Dr. [name] referred her to Dr. [name], a podiatrist whom she has been seeing for the last 6 months for steroid injections in her back.  Patient said that Dr. [name] is planning on referring her to another specialist for neck injections.  Patient said that Dr. [name] would not write her a work note since she has not been seeing her for that long and referred her back to her primary care physician.  Patient needs letter for [employer] in [place], employer.  Letter needs to state when she would be able to return to work.     Patient doesn't need a referral to a specialist since she is already seeing one.

## 2019-05-12 NOTE — Telephone Encounter (Signed)
Patient aware.  Voiced understanding.  Patient had no questions or concerns.

## 2019-05-12 NOTE — Telephone Encounter (Signed)
I did not take her out of work initially ob/gyn did 05/2018-07/2018 after surgery hysterectomy   She should have been going to work and following up the specialists but able leave work for these appointments  The specialist need to comment her ability work/disability This is not a primary care issue if related to musculoskeletal pain   I filled out paperwork for her to be able to go to the specialists appointments PT, podiatry, PM&R and ortho she should not be out of work for this long   I will not be not be filling out disability paperwork or determining if she can or cant work   This will need to be determined by the specialists managing her pain and musculoskeletal issues    Dr. Olivia Mackie McLean-Scocuzza

## 2019-11-08 ENCOUNTER — Telehealth: Payer: Self-pay | Admitting: Internal Medicine

## 2019-11-08 NOTE — Telephone Encounter (Signed)
Please advise 

## 2019-11-08 NOTE — Telephone Encounter (Signed)
Call and triage pt We need more info; many things can cause swelling including blood clot, vascular or heart problem.  When did swelling start? Does she have trouble breathing?  Has she a history of blood clots?  Does she have h/o heart failure?   She needs to have urgent care evaluation for acute concern. We can certainly then refer her to cardiology however as I dont know when she will be seen , she needs to be evaluated.

## 2019-11-08 NOTE — Telephone Encounter (Signed)
Pt called in an wants a referral to see Dr. Saunders Revel. She said she has swelling in her ankles and legs and wants to see if he can help her with that. She said if she can't get in with him could she refer her somewhere else. I did let her know that Dr. Olivia Mackie was out this week but I would send the message to her nurse.

## 2019-11-08 NOTE — Telephone Encounter (Signed)
FYI spoke with patient & she stated that the swelling has been going on in both her legs x 1-2 months. She said that the skin on her ankles now looks shiny on both sides. She confirmed no SOB, no hx of blood clots, or heart failure.  Patient wanted to be seen in office not UC. She is scheduled in your same day Dr. Olivia Mackie next Wednesday at 11:30.

## 2019-11-12 NOTE — Telephone Encounter (Signed)
I rec she keep appt  But if she cant make it schedule week of 12/01/19 and in the meantime if her legs get worse rec go to ED or urgent care  [place]

## 2019-11-12 NOTE — Telephone Encounter (Signed)
Will hold for Dr Audrie Gallus response when she is back in office.

## 2019-11-12 NOTE — Telephone Encounter (Signed)
Pt called in today and said her daughter will be in the hospital all next week and she has to stay with her so she can't do the appt on 11/17/19. The next available that I could schedule was the 12/01/19. She said that was fine b/c her legs are going down. She started to drink more water and this is helping. Didn't know if we wanted to try to get her in sooner but I know she can't do next week at all. Please advise.

## 2019-11-15 NOTE — Telephone Encounter (Signed)
Left message with this information in it and to return call.

## 2019-11-17 ENCOUNTER — Ambulatory Visit: Payer: BC Managed Care – PPO | Admitting: Internal Medicine

## 2019-11-17 NOTE — Telephone Encounter (Signed)
Patient informed and verbalized understanding.  Appointment changed to 3/23 at 8:00 am due to patient's work schedule.

## 2019-11-29 ENCOUNTER — Other Ambulatory Visit: Payer: Self-pay

## 2019-11-30 ENCOUNTER — Encounter: Payer: Self-pay | Admitting: Internal Medicine

## 2019-11-30 ENCOUNTER — Ambulatory Visit: Payer: BC Managed Care – PPO | Admitting: Internal Medicine

## 2019-11-30 VITALS — BP 140/78 | HR 74 | Temp 97.0°F | Ht 64.0 in | Wt 222.0 lb

## 2019-11-30 DIAGNOSIS — R7303 Prediabetes: Secondary | ICD-10-CM

## 2019-11-30 DIAGNOSIS — M5416 Radiculopathy, lumbar region: Secondary | ICD-10-CM

## 2019-11-30 DIAGNOSIS — E611 Iron deficiency: Secondary | ICD-10-CM

## 2019-11-30 DIAGNOSIS — Z1211 Encounter for screening for malignant neoplasm of colon: Secondary | ICD-10-CM

## 2019-11-30 DIAGNOSIS — M79644 Pain in right finger(s): Secondary | ICD-10-CM

## 2019-11-30 DIAGNOSIS — G4700 Insomnia, unspecified: Secondary | ICD-10-CM

## 2019-11-30 DIAGNOSIS — E559 Vitamin D deficiency, unspecified: Secondary | ICD-10-CM | POA: Diagnosis not present

## 2019-11-30 DIAGNOSIS — R030 Elevated blood-pressure reading, without diagnosis of hypertension: Secondary | ICD-10-CM | POA: Diagnosis not present

## 2019-11-30 DIAGNOSIS — M5030 Other cervical disc degeneration, unspecified cervical region: Secondary | ICD-10-CM

## 2019-11-30 DIAGNOSIS — J329 Chronic sinusitis, unspecified: Secondary | ICD-10-CM

## 2019-11-30 DIAGNOSIS — K219 Gastro-esophageal reflux disease without esophagitis: Secondary | ICD-10-CM

## 2019-11-30 DIAGNOSIS — R600 Localized edema: Secondary | ICD-10-CM | POA: Diagnosis not present

## 2019-11-30 DIAGNOSIS — M5441 Lumbago with sciatica, right side: Secondary | ICD-10-CM

## 2019-11-30 DIAGNOSIS — M25522 Pain in left elbow: Secondary | ICD-10-CM

## 2019-11-30 DIAGNOSIS — Z1231 Encounter for screening mammogram for malignant neoplasm of breast: Secondary | ICD-10-CM

## 2019-11-30 DIAGNOSIS — M5442 Lumbago with sciatica, left side: Secondary | ICD-10-CM

## 2019-11-30 DIAGNOSIS — M62838 Other muscle spasm: Secondary | ICD-10-CM

## 2019-11-30 DIAGNOSIS — G629 Polyneuropathy, unspecified: Secondary | ICD-10-CM

## 2019-11-30 DIAGNOSIS — G8929 Other chronic pain: Secondary | ICD-10-CM

## 2019-11-30 LAB — COMPREHENSIVE METABOLIC PANEL
ALT: 8 U/L (ref 0–35)
AST: 12 U/L (ref 0–37)
Albumin: 4.1 g/dL (ref 3.5–5.2)
Alkaline Phosphatase: 73 U/L (ref 39–117)
BUN: 14 mg/dL (ref 6–23)
CO2: 30 mEq/L (ref 19–32)
Calcium: 9.7 mg/dL (ref 8.4–10.5)
Chloride: 105 mEq/L (ref 96–112)
Creatinine, Ser: 0.8 mg/dL (ref 0.40–1.20)
GFR: 91.66 mL/min (ref >60.00)
Glucose, Bld: 98 mg/dL (ref 70–99)
Potassium: 4.5 mEq/L (ref 3.5–5.1)
Sodium: 140 mEq/L (ref 135–145)
Total Bilirubin: 0.2 mg/dL (ref 0.2–1.2)
Total Protein: 6.5 g/dL (ref 6.0–8.3)

## 2019-11-30 LAB — CBC WITH DIFFERENTIAL/PLATELET
Basophils Absolute: 0 10*3/uL (ref 0.0–0.1)
Basophils Relative: 0.7 % (ref 0.0–3.0)
Eosinophils Absolute: 0.1 10*3/uL (ref 0.0–0.7)
Eosinophils Relative: 1.9 % (ref 0.0–5.0)
HCT: 35.6 % — ABNORMAL LOW (ref 36.0–46.0)
Hemoglobin: 11.4 g/dL — ABNORMAL LOW (ref 12.0–15.0)
Lymphocytes Relative: 32.8 % (ref 12.0–46.0)
Lymphs Abs: 1.8 10*3/uL (ref 0.7–4.0)
MCHC: 32.1 g/dL (ref 30.0–36.0)
MCV: 90.4 fl (ref 78.0–100.0)
Monocytes Absolute: 0.5 10*3/uL (ref 0.1–1.0)
Monocytes Relative: 9.3 % (ref 3.0–12.0)
Neutro Abs: 3 10*3/uL (ref 1.4–7.7)
Neutrophils Relative %: 55.3 % (ref 43.0–77.0)
Platelets: 268 10*3/uL (ref 150.0–400.0)
RBC: 3.94 Mil/uL (ref 3.87–5.11)
RDW: 15.6 % — ABNORMAL HIGH (ref 11.5–15.5)
WBC: 5.3 10*3/uL (ref 4.0–10.5)

## 2019-11-30 LAB — LIPID PANEL
Cholesterol: 169 mg/dL (ref 0–200)
HDL: 36.1 mg/dL — ABNORMAL LOW (ref >39.00)
LDL Cholesterol: 111 mg/dL — ABNORMAL HIGH (ref 0–99)
NonHDL: 133.39
Total CHOL/HDL Ratio: 5
Triglycerides: 114 mg/dL (ref 0.0–149.0)
VLDL: 22.8 mg/dL (ref 0.0–40.0)

## 2019-11-30 LAB — HEMOGLOBIN A1C: Hgb A1c MFr Bld: 5.8 % (ref 4.6–6.5)

## 2019-11-30 LAB — VITAMIN D 25 HYDROXY (VIT D DEFICIENCY, FRACTURES): VITD: 34.53 ng/mL (ref 30.00–100.00)

## 2019-11-30 MED ORDER — ZOLPIDEM TARTRATE ER 6.25 MG PO TBCR: 6.2500 mg | EXTENDED_RELEASE_TABLET | Freq: Every evening | ORAL | 5 refills | Status: DC | PRN

## 2019-11-30 MED ORDER — CETIRIZINE HCL 10 MG PO TABS: 10.0000 mg | ORAL_TABLET | Freq: Every day | ORAL | 3 refills | Status: DC

## 2019-11-30 MED ORDER — FLUTICASONE PROPIONATE 50 MCG/ACT NA SUSP: 2.0000 | Freq: Every day | NASAL | 12 refills | Status: DC

## 2019-11-30 MED ORDER — GABAPENTIN 300 MG PO CAPS: 300.0000 mg | ORAL_CAPSULE | Freq: Every day | ORAL | 3 refills | Status: DC

## 2019-11-30 MED ORDER — CYCLOBENZAPRINE HCL 10 MG PO TABS: 10.0000 mg | ORAL_TABLET | Freq: Every evening | ORAL | 5 refills | Status: DC | PRN

## 2019-11-30 MED ORDER — CHOLECALCIFEROL 1.25 MG (50000 UT) PO CAPS: 50000.0000 [IU] | ORAL_CAPSULE | ORAL | 0 refills | Status: DC

## 2019-11-30 MED ORDER — FUROSEMIDE 20 MG PO TABS: 20.0000 mg | ORAL_TABLET | Freq: Every day | ORAL | 2 refills | Status: DC

## 2019-11-30 MED ORDER — OMEPRAZOLE 40 MG PO CPDR: 40.0000 mg | DELAYED_RELEASE_CAPSULE | Freq: Every day | ORAL | 3 refills | Status: DC
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
## 2019-11-30 NOTE — Patient Instructions (Addendum)
[facility]  [place], [place] [address]  [place],  [address]  [phone]  [phone] (Fax)     Edema  Edema is an abnormal buildup of fluids in the body tissues and under the skin. Swelling of the legs, feet, and ankles is a common symptom that becomes more likely as you get older. Swelling is also common in looser tissues, like around the eyes. When the affected area is squeezed, the fluid may move out of that spot and leave a dent for a few moments. This dent is called pitting edema. There are many possible causes of edema. Eating too much salt (sodium) and being on your feet or sitting for a long time can cause edema in your legs, feet, and ankles. Hot weather may make edema worse. Common causes of edema include:  Heart failure.  Liver or kidney disease.  Weak leg blood vessels.  Cancer.  An injury.  Pregnancy.  Medicines.  Being obese.  Low protein levels in the blood. Edema is usually painless. Your skin may look swollen or shiny. Follow these instructions at home:  Keep the affected body part raised (elevated) above the level of your heart when you are sitting or lying down.  Do not sit still or stand for long periods of time.  Do not wear tight clothing. Do not wear garters on your upper legs.  Exercise your legs to get your circulation going. This helps to move the fluid back into your blood vessels, and it may help the swelling go down.  Wear elastic bandages or support stockings to reduce swelling as told by your health care provider.  Eat a low-salt (low-sodium) diet to reduce fluid as told by your health care provider.  Depending on the cause of your swelling, you may need to limit how much fluid you drink (fluid restriction).  Take over-the-counter and prescription medicines only as told by your health care provider. Contact a health care provider if:  Your edema does not  get better with treatment.  You have heart, liver, or kidney disease and have symptoms of edema.  You have sudden and unexplained weight gain. Get help right away if:  You develop shortness of breath or chest pain.  You cannot breathe when you lie down.  You develop pain, redness, or warmth in the swollen areas.  You have heart, liver, or kidney disease and suddenly get edema.  You have a fever and your symptoms suddenly get worse. Summary  Edema is an abnormal buildup of fluids in the body tissues and under the skin.  Eating too much salt (sodium) and being on your feet or sitting for a long time can cause edema in your legs, feet, and ankles.  Keep the affected body part raised (elevated) above the level of your heart when you are sitting or lying down. This information is not intended to replace advice given to you by your health care provider. Make sure you discuss any questions you have with your health care provider. Document Revised: 01/13/2019 Document Reviewed: 09/28/2016 Elsevier Patient Education  [place] DASH stands for "Dietary Approaches to Stop Hypertension." The DASH eating plan is a healthy eating plan that has been shown to reduce high blood pressure (hypertension). It may also reduce your risk for type 2 diabetes, heart disease, and stroke. The DASH eating plan may also help with weight loss. What are tips for following  this plan?  General guidelines  Avoid eating more than 2,300 mg (milligrams) of salt (sodium) a day. If you have hypertension, you may need to reduce your sodium intake to 1,500 mg a day.  Limit alcohol intake to no more than 1 drink a day for nonpregnant women and 2 drinks a day for men. One drink equals 12 oz of beer, 5 oz of wine, or 1 oz of hard liquor.  Work with your health care provider to maintain a healthy body weight or to lose weight. Ask what an ideal weight is for you.  Get at least 30 minutes of  exercise that causes your heart to beat faster (aerobic exercise) most days of the week. Activities may include walking, swimming, or biking.  Work with your health care provider or diet and nutrition specialist (dietitian) to adjust your eating plan to your individual calorie needs. Reading food labels   Check food labels for the amount of sodium per serving. Choose foods with less than 5 percent of the Daily Value of sodium. Generally, foods with less than 300 mg of sodium per serving fit into this eating plan.  To find whole grains, look for the word "whole" as the first word in the ingredient list. Shopping  Buy products labeled as "low-sodium" or "no salt added."  Buy fresh foods. Avoid canned foods and premade or frozen meals. Cooking  Avoid adding salt when cooking. Use salt-free seasonings or herbs instead of table salt or sea salt. Check with your health care provider or pharmacist before using salt substitutes.  Do not fry foods. Cook foods using healthy methods such as baking, boiling, grilling, and broiling instead.  Cook with heart-healthy oils, such as olive, canola, soybean, or sunflower oil. Meal planning  Eat a balanced diet that includes: ? 5 or more servings of fruits and vegetables each day. At each meal, try to fill half of your plate with fruits and vegetables. ? Up to 6-8 servings of whole grains each day. ? Less than 6 oz of lean meat, poultry, or fish each day. A 3-oz serving of meat is about the same size as a deck of cards. One egg equals 1 oz. ? 2 servings of low-fat dairy each day. ? A serving of nuts, seeds, or beans 5 times each week. ? Heart-healthy fats. Healthy fats called Omega-3 fatty acids are found in foods such as flaxseeds and coldwater fish, like sardines, salmon, and mackerel.  Limit how much you eat of the following: ? Canned or prepackaged foods. ? Food that is high in trans fat, such as fried foods. ? Food that is high in saturated fat,  such as fatty meat. ? Sweets, desserts, sugary drinks, and other foods with added sugar. ? Full-fat dairy products.  Do not salt foods before eating.  Try to eat at least 2 vegetarian meals each week.  Eat more home-cooked food and less restaurant, buffet, and fast food.  When eating at a restaurant, ask that your food be prepared with less salt or no salt, if possible. What foods are recommended? The items listed may not be a complete list. Talk with your dietitian about what dietary choices are best for you. Grains Whole-grain or whole-wheat bread. Whole-grain or whole-wheat pasta. Brown rice. [place]. Bulgur. Whole-grain and low-sodium cereals. Pita bread. Low-fat, low-sodium crackers. Whole-wheat flour tortillas. Vegetables Fresh or frozen vegetables (raw, steamed, roasted, or grilled). Low-sodium or reduced-sodium tomato and vegetable juice. Low-sodium or reduced-sodium tomato sauce and tomato paste.  Low-sodium or reduced-sodium canned vegetables. Fruits All fresh, dried, or frozen fruit. Canned fruit in natural juice (without added sugar). Meat and other protein foods Skinless chicken or [place]. Ground chicken or [place]. Pork with fat trimmed off. Fish and seafood. Egg whites. Dried beans, peas, or lentils. Unsalted nuts, nut butters, and seeds. Unsalted canned beans. Lean cuts of beef with fat trimmed off. Low-sodium, lean deli meat. Dairy Low-fat (1%) or fat-free (skim) milk. Fat-free, low-fat, or reduced-fat cheeses. Nonfat, low-sodium ricotta or cottage cheese. Low-fat or nonfat yogurt. Low-fat, low-sodium cheese. Fats and oils Soft margarine without trans fats. Vegetable oil. Low-fat, reduced-fat, or light mayonnaise and salad dressings (reduced-sodium). Canola, safflower, olive, soybean, and sunflower oils. Avocado. Seasoning and other foods Herbs. Spices. Seasoning mixes without salt. Unsalted popcorn and pretzels. Fat-free sweets. What foods are not recommended? The  items listed may not be a complete list. Talk with your dietitian about what dietary choices are best for you. Grains Baked goods made with fat, such as croissants, muffins, or some breads. Dry pasta or rice meal packs. Vegetables Creamed or fried vegetables. Vegetables in a cheese sauce. Regular canned vegetables (not low-sodium or reduced-sodium). Regular canned tomato sauce and paste (not low-sodium or reduced-sodium). Regular tomato and vegetable juice (not low-sodium or reduced-sodium). Angie Fava. Olives. Fruits Canned fruit in a light or heavy syrup. Fried fruit. Fruit in cream or butter sauce. Meat and other protein foods Fatty cuts of meat. Ribs. Fried meat. Berniece Salines. Sausage. Bologna and other processed lunch meats. Salami. Fatback. Hotdogs. Bratwurst. Salted nuts and seeds. Canned beans with added salt. Canned or smoked fish. Whole eggs or egg yolks. Chicken or [place] with skin. Dairy Whole or 2% milk, cream, and half-and-half. Whole or full-fat cream cheese. Whole-fat or sweetened yogurt. Full-fat cheese. Nondairy creamers. Whipped toppings. Processed cheese and cheese spreads. Fats and oils Butter. Stick margarine. Lard. Shortening. Ghee. Bacon fat. Tropical oils, such as coconut, palm kernel, or palm oil. Seasoning and other foods Salted popcorn and pretzels. Onion salt, garlic salt, seasoned salt, table salt, and sea salt. Worcestershire sauce. Tartar sauce. Barbecue sauce. Teriyaki sauce. Soy sauce, including reduced-sodium. Steak sauce. Canned and packaged gravies. Fish sauce. Oyster sauce. Cocktail sauce. Horseradish that you find on the shelf. Ketchup. Mustard. Meat flavorings and tenderizers. Bouillon cubes. Hot sauce and Tabasco sauce. Premade or packaged marinades. Premade or packaged taco seasonings. Relishes. Regular salad dressings. Where to find more information:  National Heart, Lung, and [place]: https://wilson-eaton.com/  American Heart Association:  www.heart.org Summary  The DASH eating plan is a healthy eating plan that has been shown to reduce high blood pressure (hypertension). It may also reduce your risk for type 2 diabetes, heart disease, and stroke.  With the DASH eating plan, you should limit salt (sodium) intake to 2,300 mg a day. If you have hypertension, you may need to reduce your sodium intake to 1,500 mg a day.  When on the DASH eating plan, aim to eat more fresh fruits and vegetables, whole grains, lean proteins, low-fat dairy, and heart-healthy fats.  Work with your health care provider or diet and nutrition specialist (dietitian) to adjust your eating plan to your individual calorie needs. This information is not intended to replace advice given to you by your health care provider. Make sure you discuss any questions you have with your health care provider. Document Revised: 08/08/2017 Document Reviewed: 08/19/2016 Elsevier Patient Education  2020 [employer].

## 2019-12-01 ENCOUNTER — Telehealth: Payer: Self-pay | Admitting: Internal Medicine

## 2019-12-01 ENCOUNTER — Encounter: Payer: Self-pay | Admitting: Internal Medicine

## 2019-12-01 ENCOUNTER — Ambulatory Visit: Payer: BC Managed Care – PPO | Admitting: Internal Medicine

## 2019-12-01 DIAGNOSIS — D509 Iron deficiency anemia, unspecified: Secondary | ICD-10-CM | POA: Insufficient documentation

## 2019-12-01 LAB — URINALYSIS, ROUTINE W REFLEX MICROSCOPIC
Bilirubin Urine: NEGATIVE
Glucose, UA: NEGATIVE
Hgb urine dipstick: NEGATIVE
Ketones, ur: NEGATIVE
Leukocytes,Ua: NEGATIVE
Nitrite: NEGATIVE
Protein, ur: NEGATIVE
Specific Gravity, Urine: 1.023 (ref 1.001–1.03)
pH: 6.5 (ref 5.0–8.0)

## 2019-12-01 LAB — IRON,TIBC AND FERRITIN PANEL
%SAT: 10 % (calc) — ABNORMAL LOW (ref 16–45)
Ferritin: 33 ng/mL (ref 16–232)
Iron: 34 ug/dL — ABNORMAL LOW (ref 45–160)
TIBC: 332 mcg/dL (calc) (ref 250–450)

## 2019-12-01 NOTE — Telephone Encounter (Signed)
Please advise 

## 2019-12-01 NOTE — Telephone Encounter (Signed)
Patient is requesting a doctors note stating not to sit long or stand too long at work, it makes patient's legs swell. Patient is requesting to be sent to personal email, [email or web address]. Patient was advise that this letter may not be able to be sent to personal email that letter may have to be picked up at office.

## 2019-12-06 ENCOUNTER — Encounter: Payer: Self-pay | Admitting: Internal Medicine

## 2019-12-06 NOTE — Telephone Encounter (Signed)
Letter sent to printer for pick up   Thanks [place]

## 2019-12-07 NOTE — Telephone Encounter (Signed)
Patient informed and verbalized understanding and will come to pick up.

## 2019-12-27 ENCOUNTER — Encounter: Payer: Self-pay | Admitting: Internal Medicine

## 2019-12-27 DIAGNOSIS — M5440 Lumbago with sciatica, unspecified side: Secondary | ICD-10-CM | POA: Insufficient documentation

## 2019-12-27 DIAGNOSIS — M5030 Other cervical disc degeneration, unspecified cervical region: Secondary | ICD-10-CM | POA: Insufficient documentation

## 2019-12-27 DIAGNOSIS — G8929 Other chronic pain: Secondary | ICD-10-CM | POA: Insufficient documentation

## 2019-12-27 NOTE — Addendum Note (Signed)
Addended by: Orland Mustard on: 12/27/2019 09:05 PM   Modules accepted: Orders

## 2020-01-04 ENCOUNTER — Telehealth: Payer: Self-pay | Admitting: Internal Medicine

## 2020-01-04 NOTE — Telephone Encounter (Signed)
Rejection Reason - Other - Patient will not pay for uncovered services" [facility] [place] said about 22 hours ago

## 2020-01-04 NOTE — Telephone Encounter (Signed)
Inform pt of denial she is established with [facility] can go back there  [place]

## 2020-01-05 NOTE — Telephone Encounter (Signed)
Referral was sent to [facility]. I spoke with you and that you stated you cannot go to [facility] due to pay 600 in order to be seen. Please advise and Thank you!

## 2020-01-05 NOTE — Telephone Encounter (Signed)
[facility] declined pt  [facility] declined  She can go to [facility] in [place] if agreeable change referral to there or they come to [place] some days  [place]

## 2020-01-06 ENCOUNTER — Ambulatory Visit
Admission: RE | Admit: 2020-01-06 | Discharge: 2020-01-06 | Disposition: A | Discharge status: Home / Self Care | Payer: BC Managed Care – PPO | Source: Ambulatory Visit | Attending: Physician Assistant | Admitting: Physician Assistant

## 2020-01-06 ENCOUNTER — Other Ambulatory Visit: Payer: Self-pay | Admitting: Physician Assistant

## 2020-01-06 ENCOUNTER — Other Ambulatory Visit: Payer: Self-pay

## 2020-01-06 DIAGNOSIS — M7989 Other specified soft tissue disorders: Secondary | ICD-10-CM

## 2020-01-06 DIAGNOSIS — M79662 Pain in left lower leg: Secondary | ICD-10-CM | POA: Diagnosis present

## 2020-01-06 NOTE — Telephone Encounter (Signed)
Pt called back to schedule appt.

## 2020-01-06 NOTE — Telephone Encounter (Signed)
Bertram I'll call pt back. Thanks

## 2020-02-15 ENCOUNTER — Ambulatory Visit: Payer: BC Managed Care – PPO | Admitting: Gastroenterology

## 2020-02-22 ENCOUNTER — Other Ambulatory Visit: Payer: Self-pay | Admitting: Internal Medicine

## 2020-02-22 DIAGNOSIS — R600 Localized edema: Secondary | ICD-10-CM

## 2020-02-22 MED ORDER — FUROSEMIDE 20 MG PO TABS: 20.0000 mg | ORAL_TABLET | Freq: Every day | ORAL | 5 refills | Status: DC

## 2020-03-07 ENCOUNTER — Ambulatory Visit (INDEPENDENT_AMBULATORY_CARE_PROVIDER_SITE_OTHER): Payer: BC Managed Care – PPO | Admitting: Internal Medicine

## 2020-03-07 ENCOUNTER — Encounter: Payer: Self-pay | Admitting: Internal Medicine

## 2020-03-07 ENCOUNTER — Other Ambulatory Visit: Payer: Self-pay

## 2020-03-07 VITALS — BP 138/86 | HR 96 | Temp 98.1°F | Ht 64.0 in | Wt 223.8 lb

## 2020-03-07 DIAGNOSIS — G4700 Insomnia, unspecified: Secondary | ICD-10-CM

## 2020-03-07 DIAGNOSIS — E611 Iron deficiency: Secondary | ICD-10-CM | POA: Insufficient documentation

## 2020-03-07 DIAGNOSIS — K219 Gastro-esophageal reflux disease without esophagitis: Secondary | ICD-10-CM

## 2020-03-07 DIAGNOSIS — I10 Essential (primary) hypertension: Secondary | ICD-10-CM | POA: Diagnosis not present

## 2020-03-07 DIAGNOSIS — S060X0D Concussion without loss of consciousness, subsequent encounter: Secondary | ICD-10-CM

## 2020-03-07 DIAGNOSIS — R519 Headache, unspecified: Secondary | ICD-10-CM

## 2020-03-07 DIAGNOSIS — R413 Other amnesia: Secondary | ICD-10-CM

## 2020-03-07 DIAGNOSIS — F339 Major depressive disorder, recurrent, unspecified: Secondary | ICD-10-CM

## 2020-03-07 MED ORDER — AMLODIPINE BESYLATE 2.5 MG PO TABS: 2.5000 mg | ORAL_TABLET | Freq: Every day | ORAL | 3 refills | Status: DC

## 2020-03-07 MED ORDER — OMEPRAZOLE 40 MG PO CPDR: 40.0000 mg | DELAYED_RELEASE_CAPSULE | Freq: Every day | ORAL | 3 refills | Status: DC

## 2020-03-07 MED ORDER — ZOLPIDEM TARTRATE ER 6.25 MG PO TBCR: 6.2500 mg | EXTENDED_RELEASE_TABLET | Freq: Every evening | ORAL | 5 refills | Status: DC | PRN
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
## 2020-03-07 NOTE — Patient Instructions (Addendum)
Polyiron 150 mg daily  Ferrous sulfate 325 mg or 65 FE or in multivitamin with iron  Stop amitriptyline   Will fax note to Dr. [facility]    Prediabetes Eating Plan Prediabetes is a condition that causes blood sugar (glucose) levels to be higher than normal. This increases the risk for developing diabetes. In order to prevent diabetes from developing, your health care provider may recommend a diet and other lifestyle changes to help you:  Control your blood glucose levels.  Improve your cholesterol levels.  Manage your blood pressure. Your health care provider may recommend working with a diet and nutrition specialist (dietitian) to make a meal plan that is best for you. What are tips for following this plan? Lifestyle  Set weight loss goals with the help of your health care team. It is recommended that most people with prediabetes lose 7% of their current body weight.  Exercise for at least 30 minutes at least 5 days a week.  Attend a support group or seek ongoing support from a mental health counselor.  Take over-the-counter and prescription medicines only as told by your health care provider. Reading food labels  Read food labels to check the amount of fat, salt (sodium), and sugar in prepackaged foods. Avoid foods that have: ? Saturated fats. ? Trans fats. ? Added sugars.  Avoid foods that have more than 300 milligrams (mg) of sodium per serving. Limit your daily sodium intake to less than 2,300 mg each day. Shopping  Avoid buying pre-made and processed foods. Cooking  Cook with olive oil. Do not use butter, lard, or ghee.  Bake, broil, grill, or boil foods. Avoid frying. Meal planning   Work with your dietitian to develop an eating plan that is right for you. This may include: ? Tracking how many calories you take in. Use a food diary, notebook, or mobile application to track what you eat at each meal. ? Using the glycemic index (GI) to plan your meals.  The index tells you how quickly a food will raise your blood glucose. Choose low-GI foods. These foods take a longer time to raise blood glucose.  Consider following a Mediterranean diet. This diet includes: ? Several servings each day of fresh fruits and vegetables. ? Eating fish at least twice a week. ? Several servings each day of whole grains, beans, nuts, and seeds. ? Using olive oil instead of other fats. ? Moderate alcohol consumption. ? Eating small amounts of red meat and whole-fat dairy.  If you have high blood pressure, you may need to limit your sodium intake or follow a diet such as the DASH eating plan. DASH is an eating plan that aims to lower high blood pressure. What foods are recommended? The items listed below may not be a complete list. Talk with your dietitian about what dietary choices are best for you. Grains Whole grains, such as whole-wheat or whole-grain breads, crackers, cereals, and pasta. Unsweetened oatmeal. Bulgur. Barley. Quinoa. Brown rice. Corn or whole-wheat flour tortillas or taco shells. Vegetables Lettuce. Spinach. Peas. Beets. Cauliflower. Cabbage. Broccoli. Carrots. Tomatoes. Squash. Eggplant. Herbs. Peppers. Onions. Cucumbers. Brussels sprouts. Fruits Berries. Bananas. Apples. Oranges. Grapes. Papaya. Mango. Pomegranate. Kiwi. Grapefruit. Cherries. Meats and other protein foods Seafood. Poultry without skin. Lean cuts of pork and beef. Tofu. Eggs. Nuts. Beans. Dairy Low-fat or fat-free dairy products, such as yogurt, cottage cheese, and cheese. Beverages Water. Tea. Coffee. Sugar-free or diet soda. Seltzer water. Lowfat or no-fat milk. Milk alternatives, such as  soy or almond milk. Fats and oils Olive oil. Canola oil. Sunflower oil. Grapeseed oil. Avocado. Walnuts. Sweets and desserts Sugar-free or low-fat pudding. Sugar-free or low-fat ice cream and other frozen treats. Seasoning and other foods Herbs. Sodium-free spices. Mustard. Relish.  Low-fat, low-sugar ketchup. Low-fat, low-sugar barbecue sauce. Low-fat or fat-free mayonnaise. What foods are not recommended? The items listed below may not be a complete list. Talk with your dietitian about what dietary choices are best for you. Grains Refined white flour and flour products, such as bread, pasta, snack foods, and cereals. Vegetables Canned vegetables. Frozen vegetables with butter or cream sauce. Fruits Fruits canned with syrup. Meats and other protein foods Fatty cuts of meat. Poultry with skin. Breaded or fried meat. Processed meats. Dairy Full-fat yogurt, cheese, or milk. Beverages Sweetened drinks, such as sweet iced tea and soda. Fats and oils Butter. Lard. Ghee. Sweets and desserts Baked goods, such as cake, cupcakes, pastries, cookies, and cheesecake. Seasoning and other foods Spice mixes with added salt. Ketchup. Barbecue sauce. Mayonnaise. Summary  To prevent diabetes from developing, you may need to make diet and other lifestyle changes to help control blood sugar, improve cholesterol levels, and manage your blood pressure.  Set weight loss goals with the help of your health care team. It is recommended that most people with prediabetes lose 7 percent of their current body weight.  Consider following a Mediterranean diet that includes plenty of fresh fruits and vegetables, whole grains, beans, nuts, seeds, fish, lean meat, low-fat dairy, and healthy oils. This information is not intended to replace advice given to you by your health care provider. Make sure you discuss any questions you have with your health care provider. Document Revised: 12/18/2018 Document Reviewed: 10/30/2016 Elsevier Patient Education  2020 [employer].

## 2020-03-08 ENCOUNTER — Telehealth: Payer: Self-pay | Admitting: Internal Medicine

## 2020-03-08 NOTE — Telephone Encounter (Addendum)
-----   Message from [name], MD sent at 03/07/2020  8:42 AM EDT ----- Need copy of covid vaccine   Did not see this in [place]. Will call the Patient for her dates. Spoke with the Patient and she states once she finds her card she will give Stacey Roberson call back with the dates.

## 2020-03-08 NOTE — Telephone Encounter (Signed)
Faxed via [facility] routing

## 2020-03-08 NOTE — Telephone Encounter (Signed)
-----   Message from [name], MD sent at 03/07/2020  8:46 AM EDT ----- Need copy of colonoscopy send [facility] and [facility] Noted in 2012

## 2020-03-08 NOTE — Telephone Encounter (Signed)
Signed release of information sent to fax with fax numbers for [facility], [facility], and [facility] on the cover.

## 2020-03-08 NOTE — Telephone Encounter (Signed)
-----   Message from [name], MD sent at 03/07/2020  8:48 AM EDT ----- Fax note to Dr. [facility] Pt needs CT/MRI brain on cover

## 2020-05-01 ENCOUNTER — Ambulatory Visit: Payer: BC Managed Care – PPO | Admitting: Gastroenterology

## 2020-06-08 ENCOUNTER — Telehealth: Payer: BC Managed Care – PPO | Admitting: Internal Medicine

## 2020-06-08 ENCOUNTER — Telehealth: Payer: Self-pay | Admitting: Internal Medicine

## 2020-06-08 NOTE — Telephone Encounter (Signed)
Patient no-showed today's appointment; appointment was for 06/08/20 at 8:00 am, provider notified for review of record.  Letter sent for patient to call in and re-schedule.

## 2020-06-08 NOTE — Telephone Encounter (Signed)
No answer, no voicemail.  Calling to start Patient's 8:00 am visit. Link sent

## 2020-06-08 NOTE — Telephone Encounter (Signed)
No answer, no voicemail.

## 2020-07-14 ENCOUNTER — Telehealth: Payer: Self-pay | Admitting: Internal Medicine

## 2020-07-14 NOTE — Telephone Encounter (Signed)
Patient called in scheduled a appointment for 08-08-20 and wanted to know if she could get her sertraline (ZOLOFT) 100 MG tablet(Expired)  or do she have to wait until after appointment

## 2020-07-17 ENCOUNTER — Other Ambulatory Visit: Payer: Self-pay | Admitting: Internal Medicine

## 2020-07-17 DIAGNOSIS — F32A Depression, unspecified: Secondary | ICD-10-CM

## 2020-07-17 DIAGNOSIS — F419 Anxiety disorder, unspecified: Secondary | ICD-10-CM

## 2020-07-17 MED ORDER — SERTRALINE HCL 100 MG PO TABS: 150.0000 mg | ORAL_TABLET | Freq: Every day | ORAL | 11 refills | Status: DC

## 2020-07-19 ENCOUNTER — Ambulatory Visit: Payer: BC Managed Care – PPO | Admitting: Gastroenterology

## 2020-07-31 ENCOUNTER — Ambulatory Visit: Payer: BC Managed Care – PPO | Admitting: Gastroenterology

## 2020-08-08 ENCOUNTER — Ambulatory Visit (INDEPENDENT_AMBULATORY_CARE_PROVIDER_SITE_OTHER): Payer: BC Managed Care – PPO

## 2020-08-08 ENCOUNTER — Other Ambulatory Visit: Payer: Self-pay

## 2020-08-08 ENCOUNTER — Encounter: Payer: Self-pay | Admitting: Internal Medicine

## 2020-08-08 ENCOUNTER — Ambulatory Visit (INDEPENDENT_AMBULATORY_CARE_PROVIDER_SITE_OTHER): Payer: BC Managed Care – PPO | Admitting: Internal Medicine

## 2020-08-08 VITALS — BP 130/86 | HR 87 | Temp 98.0°F | Ht 64.0 in | Wt 233.1 lb

## 2020-08-08 DIAGNOSIS — E611 Iron deficiency: Secondary | ICD-10-CM

## 2020-08-08 DIAGNOSIS — S99912A Unspecified injury of left ankle, initial encounter: Secondary | ICD-10-CM

## 2020-08-08 DIAGNOSIS — R102 Pelvic and perineal pain: Secondary | ICD-10-CM

## 2020-08-08 DIAGNOSIS — I10 Essential (primary) hypertension: Secondary | ICD-10-CM

## 2020-08-08 DIAGNOSIS — R1030 Lower abdominal pain, unspecified: Secondary | ICD-10-CM | POA: Insufficient documentation

## 2020-08-08 DIAGNOSIS — G4700 Insomnia, unspecified: Secondary | ICD-10-CM

## 2020-08-08 DIAGNOSIS — F419 Anxiety disorder, unspecified: Secondary | ICD-10-CM | POA: Diagnosis not present

## 2020-08-08 DIAGNOSIS — M79672 Pain in left foot: Secondary | ICD-10-CM

## 2020-08-08 DIAGNOSIS — D509 Iron deficiency anemia, unspecified: Secondary | ICD-10-CM

## 2020-08-08 DIAGNOSIS — Z1329 Encounter for screening for other suspected endocrine disorder: Secondary | ICD-10-CM

## 2020-08-08 DIAGNOSIS — F32A Depression, unspecified: Secondary | ICD-10-CM

## 2020-08-08 DIAGNOSIS — N809 Endometriosis, unspecified: Secondary | ICD-10-CM | POA: Insufficient documentation

## 2020-08-08 DIAGNOSIS — Z23 Encounter for immunization: Secondary | ICD-10-CM | POA: Diagnosis not present

## 2020-08-08 DIAGNOSIS — Z1211 Encounter for screening for malignant neoplasm of colon: Secondary | ICD-10-CM

## 2020-08-08 DIAGNOSIS — R7303 Prediabetes: Secondary | ICD-10-CM

## 2020-08-08 MED ORDER — SERTRALINE HCL 100 MG PO TABS: 150.0000 mg | ORAL_TABLET | Freq: Every day | ORAL | 11 refills | Status: DC

## 2020-08-08 MED ORDER — HYDROCODONE-ACETAMINOPHEN 5-325 MG PO TABS: 1.0000 | ORAL_TABLET | Freq: Two times a day (BID) | ORAL | 0 refills | Status: DC | PRN

## 2020-08-08 MED ORDER — SERTRALINE HCL 50 MG PO TABS: ORAL_TABLET | ORAL | 0 refills | Status: DC

## 2020-08-08 MED ORDER — ZOLPIDEM TARTRATE ER 6.25 MG PO TBCR: 6.2500 mg | EXTENDED_RELEASE_TABLET | Freq: Every evening | ORAL | 5 refills | Status: DC | PRN

## 2020-08-08 MED ORDER — CITALOPRAM HYDROBROMIDE 10 MG PO TABS: 10.0000 mg | ORAL_TABLET | Freq: Every day | ORAL | 3 refills | Status: DC

## 2020-08-08 MED ORDER — METHYLPREDNISOLONE ACETATE 80 MG/ML IJ SUSP
80.0000 mg | Freq: Once | INTRAMUSCULAR | Status: AC
  Administered 2020-08-08: 80 mg via INTRAMUSCULAR

## 2020-08-08 NOTE — Patient Instructions (Signed)
Ankle Pain The ankle joint holds your body weight and allows you to move around. Ankle pain can occur on either side or the back of one ankle or both ankles. Ankle pain may be sharp and burning or dull and aching. There may be tenderness, stiffness, redness, or warmth around the ankle. Many things can cause ankle pain, including an injury to the area and overuse of the ankle. Follow these instructions at home: Activity  Rest your ankle as told by your health care provider. Avoid any activities that cause ankle pain.  Do not use the injured limb to support your body weight until your health care provider says that you can. Use crutches as told by your health care provider.  Do exercises as told by your health care provider.  Ask your health care provider when it is safe to drive if you have a brace on your ankle. If you have a brace:  Wear the brace as told by your health care provider. Remove it only as told by your health care provider.  Loosen the brace if your toes tingle, become numb, or turn cold and blue.  Keep the brace clean.  If the brace is not waterproof: ? Do not let it get wet. ? Cover it with a watertight covering when you take a bath or shower. If you were given an elastic bandage:   Remove it when you take a bath or a shower.  Try not to move your ankle very much, but wiggle your toes from time to time. This helps to prevent swelling.  Adjust the bandage to make it more comfortable if it feels too tight.  Loosen the bandage if you have numbness or tingling in your foot or if your foot turns cold and blue. Managing pain, stiffness, and swelling   If directed, put ice on the painful area. ? If you have a removable brace or elastic bandage, remove it as told by your health care provider. ? Put ice in a plastic bag. ? Place a towel between your skin and the bag. ? Leave the ice on for 20 minutes, 2-3 times a day.  Move your toes often to avoid stiffness and to  lessen swelling.  Raise (elevate) your ankle above the level of your heart while you are sitting or lying down. General instructions  Record information about your pain. Writing down the following may be helpful for you and your health care provider: ? How often you have ankle pain. ? Where the pain is located. ? What the pain feels like.  If treatment involves wearing a prescribed shoe or insole, make sure you wear it correctly and for as long as told by your health care provider.  Take over-the-counter and prescription medicines only as told by your health care provider.  Keep all follow-up visits as told by your health care provider. This is important. Contact a health care provider if:  Your pain gets worse.  Your pain is not relieved with medicines.  You have a fever or chills.  You are having more trouble with walking.  You have new symptoms. Get help right away if:  Your foot, leg, toes, or ankle: ? Tingles or becomes numb. ? Becomes swollen. ? Turns pale or blue. Summary  Ankle pain can occur on either side or the back of one ankle or both ankles.  Ankle pain may be sharp and burning or dull and aching.  Rest your ankle as told by your health care provider.   If told, apply ice to the area.  Take over-the-counter and prescription medicines only as told by your health care provider. This information is not intended to replace advice given to you by your health care provider. Make sure you discuss any questions you have with your health care provider. Document Revised: 12/15/2018 Document Reviewed: 03/04/2018 Elsevier Patient Education  2020 Elsevier Inc.  
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
## 2020-08-10 ENCOUNTER — Other Ambulatory Visit: Payer: BC Managed Care – PPO

## 2020-08-15 ENCOUNTER — Telehealth: Payer: BC Managed Care – PPO | Admitting: Family Medicine

## 2020-08-15 ENCOUNTER — Other Ambulatory Visit: Payer: Self-pay
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
## 2020-08-16 ENCOUNTER — Telehealth: Payer: Self-pay | Admitting: Internal Medicine

## 2020-08-16 NOTE — Telephone Encounter (Signed)
Please advise Patient seen 08/08/20

## 2020-08-16 NOTE — Telephone Encounter (Signed)
Pt called she is wanting Dr. Olivia Mackie to write her out of work for another week because of her foot and ankle

## 2020-08-16 NOTE — Telephone Encounter (Signed)
Patient called stated that her foo has not gotten any better want to know if she has been set up for a podiatry yet have not received a call

## 2020-08-16 NOTE — Telephone Encounter (Signed)
Please advise on note for work.   Will give the Patient the below information for podiatry: Dr. Gracelyn Nurse, [place] [phone]

## 2020-08-17 NOTE — Progress Notes (Signed)
CMA was unable to reach the patient for her visit.

## 2020-08-18 ENCOUNTER — Other Ambulatory Visit: Payer: BC Managed Care – PPO

## 2020-08-21 ENCOUNTER — Ambulatory Visit: Payer: Self-pay | Admitting: Obstetrics and Gynecology

## 2020-08-22 ENCOUNTER — Telehealth: Payer: Self-pay | Admitting: Internal Medicine

## 2020-08-22 ENCOUNTER — Telehealth: Payer: BC Managed Care – PPO | Admitting: Internal Medicine

## 2020-08-22 DIAGNOSIS — Z0289 Encounter for other administrative examinations: Secondary | ICD-10-CM

## 2020-08-22 NOTE — Telephone Encounter (Signed)
Left message to return call on home and mobile number. Attempting to start 8:30 am video visit.

## 2020-08-22 NOTE — Telephone Encounter (Signed)
Patient no-showed today's appointment; appointment was for 12/14 at 8:30 am , provider notified for review of record. Mychart message sent for patient to re-schedule.

## 2020-08-22 NOTE — Telephone Encounter (Signed)
Left message to return call on both numbers. Informed that if we do not hear back by 8:40 appointment will be canceled.

## 2020-08-23 ENCOUNTER — Telehealth: Payer: BC Managed Care – PPO | Admitting: Internal Medicine

## 2020-08-25 ENCOUNTER — Telehealth: Payer: Self-pay | Admitting: Internal Medicine

## 2020-08-25 NOTE — Telephone Encounter (Signed)
Pt having foot pain can you reach out to her for an appt   Thanks no sure why appt has not been scheduled yet

## 2020-08-25 NOTE — Telephone Encounter (Signed)
What is going on with status of foot MD referral?   Thanks

## 2020-08-28 NOTE — Telephone Encounter (Signed)
Could you please help this lady with her referral? Give her to [place].

## 2020-08-28 NOTE — Telephone Encounter (Signed)
I called and left a voicemail on both numbers for pt and I also sent her a message via [place]. Offered to have her come here to [facility] tomorrow to see Dr. Posey Pronto and make her follow ups in the [facility] office with him. I gave her my direct number and the main office number to call as well.

## 2020-08-30 ENCOUNTER — Encounter: Payer: Self-pay | Admitting: Internal Medicine

## 2020-08-30 ENCOUNTER — Other Ambulatory Visit: Payer: Self-pay

## 2020-08-30 ENCOUNTER — Ambulatory Visit (INDEPENDENT_AMBULATORY_CARE_PROVIDER_SITE_OTHER): Payer: BC Managed Care – PPO | Admitting: Internal Medicine

## 2020-08-30 VITALS — BP 150/96 | HR 93 | Temp 97.9°F | Ht 64.0 in | Wt 234.4 lb

## 2020-08-30 DIAGNOSIS — M25572 Pain in left ankle and joints of left foot: Secondary | ICD-10-CM | POA: Diagnosis not present

## 2020-08-30 DIAGNOSIS — I10 Essential (primary) hypertension: Secondary | ICD-10-CM

## 2020-08-30 DIAGNOSIS — R600 Localized edema: Secondary | ICD-10-CM | POA: Diagnosis not present

## 2020-08-30 DIAGNOSIS — F419 Anxiety disorder, unspecified: Secondary | ICD-10-CM | POA: Diagnosis not present

## 2020-08-30 DIAGNOSIS — F32A Depression, unspecified: Secondary | ICD-10-CM

## 2020-08-30 MED ORDER — AMLODIPINE BESYLATE 2.5 MG PO TABS: 2.5000 mg | ORAL_TABLET | Freq: Every day | ORAL | 3 refills | Status: DC

## 2020-08-30 MED ORDER — FUROSEMIDE 20 MG PO TABS: 20.0000 mg | ORAL_TABLET | Freq: Every day | ORAL | 5 refills | Status: DC

## 2020-08-30 MED ORDER — METHYLPREDNISOLONE ACETATE 80 MG/ML IJ SUSP
80.0000 mg | Freq: Once | INTRAMUSCULAR | Status: AC
  Administered 2020-08-30: 80 mg via INTRAMUSCULAR
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
## 2020-08-30 NOTE — Patient Instructions (Signed)
F/u podiatry further plan of care/time off    DASH Eating Plan DASH stands for "Dietary Approaches to Stop Hypertension." The DASH eating plan is a healthy eating plan that has been shown to reduce high blood pressure (hypertension). It may also reduce your risk for type 2 diabetes, heart disease, and stroke. The DASH eating plan may also help with weight loss. What are tips for following this plan?  General guidelines  Avoid eating more than 2,300 mg (milligrams) of salt (sodium) a day. If you have hypertension, you may need to reduce your sodium intake to 1,500 mg a day.  Limit alcohol intake to no more than 1 drink a day for nonpregnant women and 2 drinks a day for men. One drink equals 12 oz of beer, 5 oz of wine, or 1 oz of hard liquor.  Work with your health care provider to maintain a healthy body weight or to lose weight. Ask what an ideal weight is for you.  Get at least 30 minutes of exercise that causes your heart to beat faster (aerobic exercise) most days of the week. Activities may include walking, swimming, or biking.  Work with your health care provider or diet and nutrition specialist (dietitian) to adjust your eating plan to your individual calorie needs. Reading food labels   Check food labels for the amount of sodium per serving. Choose foods with less than 5 percent of the Daily Value of sodium. Generally, foods with less than 300 mg of sodium per serving fit into this eating plan.  To find whole grains, look for the word "whole" as the first word in the ingredient list. Shopping  Buy products labeled as "low-sodium" or "no salt added."  Buy fresh foods. Avoid canned foods and premade or frozen meals. Cooking  Avoid adding salt when cooking. Use salt-free seasonings or herbs instead of table salt or sea salt. Check with your health care provider or pharmacist before using salt substitutes.  Do not fry foods. Cook foods using healthy methods such as baking,  boiling, grilling, and broiling instead.  Cook with heart-healthy oils, such as olive, canola, soybean, or sunflower oil. Meal planning  Eat a balanced diet that includes: ? 5 or more servings of fruits and vegetables each day. At each meal, try to fill half of your plate with fruits and vegetables. ? Up to 6-8 servings of whole grains each day. ? Less than 6 oz of lean meat, poultry, or fish each day. A 3-oz serving of meat is about the same size as a deck of cards. One egg equals 1 oz. ? 2 servings of low-fat dairy each day. ? A serving of nuts, seeds, or beans 5 times each week. ? Heart-healthy fats. Healthy fats called Omega-3 fatty acids are found in foods such as flaxseeds and coldwater fish, like sardines, salmon, and mackerel.  Limit how much you eat of the following: ? Canned or prepackaged foods. ? Food that is high in trans fat, such as fried foods. ? Food that is high in saturated fat, such as fatty meat. ? Sweets, desserts, sugary drinks, and other foods with added sugar. ? Full-fat dairy products.  Do not salt foods before eating.  Try to eat at least 2 vegetarian meals each week.  Eat more home-cooked food and less restaurant, buffet, and fast food.  When eating at a restaurant, ask that your food be prepared with less salt or no salt, if possible. What foods are recommended? The items listed may  not be a complete list. Talk with your dietitian about what dietary choices are best for you. Grains Whole-grain or whole-wheat bread. Whole-grain or whole-wheat pasta. Brown rice. [place]. Bulgur. Whole-grain and low-sodium cereals. Pita bread. Low-fat, low-sodium crackers. Whole-wheat flour tortillas. Vegetables Fresh or frozen vegetables (raw, steamed, roasted, or grilled). Low-sodium or reduced-sodium tomato and vegetable juice. Low-sodium or reduced-sodium tomato sauce and tomato paste. Low-sodium or reduced-sodium canned vegetables. Fruits All fresh, dried, or  frozen fruit. Canned fruit in natural juice (without added sugar). Meat and other protein foods Skinless chicken or [place]. Ground chicken or [place]. Pork with fat trimmed off. Fish and seafood. Egg whites. Dried beans, peas, or lentils. Unsalted nuts, nut butters, and seeds. Unsalted canned beans. Lean cuts of beef with fat trimmed off. Low-sodium, lean deli meat. Dairy Low-fat (1%) or fat-free (skim) milk. Fat-free, low-fat, or reduced-fat cheeses. Nonfat, low-sodium ricotta or cottage cheese. Low-fat or nonfat yogurt. Low-fat, low-sodium cheese. Fats and oils Soft margarine without trans fats. Vegetable oil. Low-fat, reduced-fat, or light mayonnaise and salad dressings (reduced-sodium). Canola, safflower, olive, soybean, and sunflower oils. Avocado. Seasoning and other foods Herbs. Spices. Seasoning mixes without salt. Unsalted popcorn and pretzels. Fat-free sweets. What foods are not recommended? The items listed may not be a complete list. Talk with your dietitian about what dietary choices are best for you. Grains Baked goods made with fat, such as croissants, muffins, or some breads. Dry pasta or rice meal packs. Vegetables Creamed or fried vegetables. Vegetables in a cheese sauce. Regular canned vegetables (not low-sodium or reduced-sodium). Regular canned tomato sauce and paste (not low-sodium or reduced-sodium). Regular tomato and vegetable juice (not low-sodium or reduced-sodium). Angie Fava. Olives. Fruits Canned fruit in a light or heavy syrup. Fried fruit. Fruit in cream or butter sauce. Meat and other protein foods Fatty cuts of meat. Ribs. Fried meat. Berniece Salines. Sausage. Bologna and other processed lunch meats. Salami. Fatback. Hotdogs. Bratwurst. Salted nuts and seeds. Canned beans with added salt. Canned or smoked fish. Whole eggs or egg yolks. Chicken or [place] with skin. Dairy Whole or 2% milk, cream, and half-and-half. Whole or full-fat cream cheese. Whole-fat or sweetened yogurt.  Full-fat cheese. Nondairy creamers. Whipped toppings. Processed cheese and cheese spreads. Fats and oils Butter. Stick margarine. Lard. Shortening. Ghee. Bacon fat. Tropical oils, such as coconut, palm kernel, or palm oil. Seasoning and other foods Salted popcorn and pretzels. Onion salt, garlic salt, seasoned salt, table salt, and sea salt. Worcestershire sauce. Tartar sauce. Barbecue sauce. Teriyaki sauce. Soy sauce, including reduced-sodium. Steak sauce. Canned and packaged gravies. Fish sauce. Oyster sauce. Cocktail sauce. Horseradish that you find on the shelf. Ketchup. Mustard. Meat flavorings and tenderizers. Bouillon cubes. Hot sauce and Tabasco sauce. Premade or packaged marinades. Premade or packaged taco seasonings. Relishes. Regular salad dressings. Where to find more information:  National Heart, Lung, and [place]: https://wilson-eaton.com/  American Heart Association: www.heart.org Summary  The DASH eating plan is a healthy eating plan that has been shown to reduce high blood pressure (hypertension). It may also reduce your risk for type 2 diabetes, heart disease, and stroke.  With the DASH eating plan, you should limit salt (sodium) intake to 2,300 mg a day. If you have hypertension, you may need to reduce your sodium intake to 1,500 mg a day.  When on the DASH eating plan, aim to eat more fresh fruits and vegetables, whole grains, lean proteins, low-fat dairy, and heart-healthy fats.  Work with your health care provider or diet and  nutrition specialist (dietitian) to adjust your eating plan to your individual calorie needs. This information is not intended to replace advice given to you by your health care provider. Make sure you discuss any questions you have with your health care provider. Document Revised: 08/08/2017 Document Reviewed: 08/19/2016 Elsevier Patient Education  [place].  Ankle Exercises Ask your health care provider which exercises are safe for you.  Do exercises exactly as told by your health care provider and adjust them as directed. It is normal to feel mild stretching, pulling, tightness, or mild discomfort as you do these exercises. Stop right away if you feel sudden pain or your pain gets worse. Do not begin these exercises until told by your health care provider. Stretching and range-of-motion exercises These exercises warm up your muscles and joints and improve the movement and flexibility of your ankle. These exercises may also help to relieve pain. Dorsiflexion/plantar flexion  1. Sit with your __________ knee straight or bent. Do not rest your foot on anything. 2. Flex your __________ ankle to tilt the top of your foot toward your shin. This is called dorsiflexion. 3. Hold this position for __________ seconds. 4. Point your toes downward to tilt the top of your foot away from your shin. This is called plantar flexion. 5. Hold this position for __________ seconds. Repeat __________ times. Complete this exercise __________ times a day. Ankle alphabet  1. Sit with your __________ foot supported at your lower leg. ? Do not rest your foot on anything. ? Make sure your foot has room to move freely. 2. Think of your __________ foot as a paintbrush: ? Move your foot to trace each letter of the alphabet in the air. Keep your hip and knee still while you trace the letters. Trace every letter from A to Z. ? Make the letters as large as you can without causing or increasing any discomfort. Repeat __________ times. Complete this exercise __________ times a day. Passive ankle dorsiflexion This is an exercise in which something or someone moves your ankle for you. You do not move it yourself. 1. Sit on a chair that is placed on a non-carpeted surface. 2. Place your __________ foot on the floor, directly under your __________ knee. Extend your __________ leg for support. 3. Keeping your heel down, slide your __________ foot back toward the  chair until you feel a stretch at your ankle or calf. If you do not feel a stretch, slide your buttocks forward to the edge of the chair while keeping your heel down. 4. Hold this stretch for __________ seconds. Repeat __________ times. Complete this exercise __________ times a day. Strengthening exercises These exercises build strength and endurance in your ankle. Endurance is the ability to use your muscles for a long time, even after they get tired. Dorsiflexors These are muscles that lift your foot up. 1. Secure a rubber exercise band or tube to an object, such as a table leg, that will stay still when the band is pulled. Secure the other end around your __________ foot. 2. Sit on the floor, facing the object with your __________ leg extended. The band or tube should be slightly tense when your foot is relaxed. 3. Slowly flex your __________ ankle and toes to bring your foot toward your shin. 4. Hold this position for __________ seconds. 5. Slowly return your foot to the starting position, controlling the band as you do that. Repeat __________ times. Complete this exercise __________ times a day. Plantar flexors  These are muscles that push your foot down. 1. Sit on the floor with your __________ leg extended. 2. Loop a rubber exercise band or tube around the ball of your __________ foot. The ball of your foot is on the walking surface, right under your toes. The band or tube should be slightly tense when your foot is relaxed. 3. Slowly point your toes downward, pushing them away from you. 4. Hold this position for __________ seconds. 5. Slowly release the tension in the band or tube, controlling smoothly until your foot is back in the starting position. Repeat __________ times. Complete this exercise __________ times a day. Towel curls  1. Sit in a chair on a non-carpeted surface, and put your feet on the floor. 2. Place a towel in front of your feet. If told by your health care provider,  add a __________ pound weight to the end of the towel. 3. Keeping your heel on the floor, put your __________ foot on the towel. 4. Pull the towel toward you by grabbing the towel with your toes and curling them under. Keep your heel on the floor. 5. Let your toes relax. 6. Grab the towel again. Keep pulling the towel until it is completely underneath your foot. Repeat __________ times. Complete this exercise __________ times a day. Standing plantar flexion This is an exercise in which you use your toes to lift your body's weight while standing. 1. Stand with your feet shoulder-width apart. 2. Keep your weight spread evenly over the width of your feet while you rise up on your toes. Use a wall or table to steady yourself if needed, but try not to use it for support. 3. If this exercise is too easy, try these options: ? Shift your weight toward your __________ leg until you feel challenged. ? If told by your health care provider, lift your uninjured leg off the floor. 4. Hold this position for __________ seconds. Repeat __________ times. Complete this exercise __________ times a day. Tandem walking 1. Stand with one foot directly in front of the other. 2. Slowly raise your back foot up, lifting your heel before your toes, and place it directly in front of your other foot. 3. Continue to walk in this heel-to-toe way for __________ or for as long as told by your health care provider. Have a countertop or wall nearby to use if needed to keep your balance, but try not to hold onto anything for support. Repeat __________ times. Complete this exercise __________ times a day. This information is not intended to replace advice given to you by your health care provider. Make sure you discuss any questions you have with your health care provider. Document Revised: 05/23/2018 Document Reviewed: 05/25/2018 Elsevier Patient Education  [place].

## 2020-08-30 NOTE — Progress Notes (Signed)
poPatient bringing forms for being out of work for foot pain. Foot pain rated 7/10 today. Patient works for [employer].  Patient was due to return to work 12/08 but stayed out until 12/14 due to foot pain persisting.  Patient states she has been calling for appointments with podiatry but they did not have anything available.

## 2020-08-31 ENCOUNTER — Encounter: Payer: Self-pay | Admitting: Podiatry

## 2020-08-31 ENCOUNTER — Ambulatory Visit (INDEPENDENT_AMBULATORY_CARE_PROVIDER_SITE_OTHER): Payer: BC Managed Care – PPO | Admitting: Podiatry

## 2020-08-31 DIAGNOSIS — S99922A Unspecified injury of left foot, initial encounter: Secondary | ICD-10-CM

## 2020-08-31 DIAGNOSIS — T148XXA Other injury of unspecified body region, initial encounter: Secondary | ICD-10-CM | POA: Diagnosis not present

## 2020-09-04 ENCOUNTER — Encounter: Payer: Self-pay | Admitting: Podiatry

## 2020-09-04 NOTE — Telephone Encounter (Signed)
Pt had her appt on 08/31/2020 at 9:30 am with Dr Allena Katz.
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
## 2020-09-06 ENCOUNTER — Ambulatory Visit: Payer: Self-pay | Admitting: Obstetrics and Gynecology

## 2020-09-18 ENCOUNTER — Ambulatory Visit: Payer: Self-pay | Admitting: Obstetrics and Gynecology

## 2020-09-28 ENCOUNTER — Ambulatory Visit: Payer: BC Managed Care – PPO | Admitting: Podiatry

## 2020-10-02 ENCOUNTER — Ambulatory Visit: Payer: Self-pay | Admitting: Obstetrics and Gynecology

## 2020-10-03 ENCOUNTER — Encounter: Payer: Self-pay | Admitting: Podiatry

## 2020-10-03 ENCOUNTER — Encounter: Payer: Self-pay | Admitting: *Deleted

## 2020-10-03 ENCOUNTER — Other Ambulatory Visit: Payer: Self-pay

## 2020-10-03 ENCOUNTER — Ambulatory Visit: Payer: BC Managed Care – PPO | Admitting: Podiatry

## 2020-10-03 DIAGNOSIS — M79676 Pain in unspecified toe(s): Secondary | ICD-10-CM

## 2020-10-03 DIAGNOSIS — S99922A Unspecified injury of left foot, initial encounter: Secondary | ICD-10-CM | POA: Diagnosis not present

## 2020-10-03 DIAGNOSIS — M7752 Other enthesopathy of left foot: Secondary | ICD-10-CM

## 2020-10-03 MED ORDER — METHYLPREDNISOLONE 4 MG PO TBPK
ORAL_TABLET | ORAL | 0 refills | Status: DC
Start: 2020-10-03 — End: 2020-10-27
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
## 2020-10-06 ENCOUNTER — Telehealth: Payer: Self-pay | Admitting: *Deleted

## 2020-10-06 NOTE — Telephone Encounter (Signed)
I have completed your FMLA request from [employer].  However, I can not send them the medical records that they are requesting.  I need your signature on a form giving [place] permission to release your clinical notes to [facility].  "I'll come by on Monday to sign it."  I'll leave it at the front desk.

## 2020-10-11 ENCOUNTER — Encounter: Payer: Self-pay | Admitting: Internal Medicine

## 2020-10-11 ENCOUNTER — Ambulatory Visit (INDEPENDENT_AMBULATORY_CARE_PROVIDER_SITE_OTHER): Payer: BC Managed Care – PPO | Admitting: Internal Medicine

## 2020-10-11 ENCOUNTER — Other Ambulatory Visit: Payer: Self-pay

## 2020-10-11 VITALS — BP 126/84 | HR 93 | Temp 98.0°F | Ht 64.0 in | Wt 233.8 lb

## 2020-10-11 DIAGNOSIS — Z1329 Encounter for screening for other suspected endocrine disorder: Secondary | ICD-10-CM

## 2020-10-11 DIAGNOSIS — I10 Essential (primary) hypertension: Secondary | ICD-10-CM

## 2020-10-11 DIAGNOSIS — R7303 Prediabetes: Secondary | ICD-10-CM

## 2020-10-11 DIAGNOSIS — R600 Localized edema: Secondary | ICD-10-CM | POA: Diagnosis not present

## 2020-10-11 DIAGNOSIS — Z0000 Encounter for general adult medical examination without abnormal findings: Secondary | ICD-10-CM

## 2020-10-11 DIAGNOSIS — G4700 Insomnia, unspecified: Secondary | ICD-10-CM

## 2020-10-11 DIAGNOSIS — E611 Iron deficiency: Secondary | ICD-10-CM

## 2020-10-11 LAB — CBC WITH DIFFERENTIAL/PLATELET
Basophils Absolute: 0 10*3/uL (ref 0.0–0.1)
Basophils Relative: 0.8 % (ref 0.0–3.0)
Eosinophils Absolute: 0.1 10*3/uL (ref 0.0–0.7)
Eosinophils Relative: 1.4 % (ref 0.0–5.0)
HCT: 38.5 % (ref 36.0–46.0)
Hemoglobin: 12.3 g/dL (ref 12.0–15.0)
Lymphocytes Relative: 38.6 % (ref 12.0–46.0)
Lymphs Abs: 1.9 10*3/uL (ref 0.7–4.0)
MCHC: 32.1 g/dL (ref 30.0–36.0)
MCV: 92.3 fl (ref 78.0–100.0)
Monocytes Absolute: 0.3 10*3/uL (ref 0.1–1.0)
Monocytes Relative: 5.9 % (ref 3.0–12.0)
Neutro Abs: 2.6 10*3/uL (ref 1.4–7.7)
Neutrophils Relative %: 53.3 % (ref 43.0–77.0)
Platelets: 282 10*3/uL (ref 150.0–400.0)
RBC: 4.17 Mil/uL (ref 3.87–5.11)
RDW: 15.7 % — ABNORMAL HIGH (ref 11.5–15.5)
WBC: 4.9 10*3/uL (ref 4.0–10.5)

## 2020-10-11 LAB — COMPREHENSIVE METABOLIC PANEL
ALT: 7 U/L (ref 0–35)
AST: 9 U/L (ref 0–37)
Albumin: 3.8 g/dL (ref 3.5–5.2)
Alkaline Phosphatase: 70 U/L (ref 39–117)
BUN: 11 mg/dL (ref 6–23)
CO2: 31 mEq/L (ref 19–32)
Calcium: 9 mg/dL (ref 8.4–10.5)
Chloride: 105 mEq/L (ref 96–112)
Creatinine, Ser: 0.86 mg/dL (ref 0.40–1.20)
GFR: 78.22 mL/min (ref >60.00)
Glucose, Bld: 104 mg/dL — ABNORMAL HIGH (ref 70–99)
Potassium: 4 mEq/L (ref 3.5–5.1)
Sodium: 140 mEq/L (ref 135–145)
Total Bilirubin: 0.5 mg/dL (ref 0.2–1.2)
Total Protein: 6.1 g/dL (ref 6.0–8.3)

## 2020-10-11 LAB — LIPID PANEL
Cholesterol: 179 mg/dL (ref 0–200)
HDL: 33.5 mg/dL — ABNORMAL LOW (ref >39.00)
LDL Cholesterol: 128 mg/dL — ABNORMAL HIGH (ref 0–99)
NonHDL: 145.96
Total CHOL/HDL Ratio: 5
Triglycerides: 91 mg/dL (ref 0.0–149.0)
VLDL: 18.2 mg/dL (ref 0.0–40.0)

## 2020-10-11 LAB — HEMOGLOBIN A1C: Hgb A1c MFr Bld: 5.9 % (ref 4.6–6.5)

## 2020-10-11 LAB — TSH: TSH: 0.72 u[IU]/mL (ref 0.35–4.50)

## 2020-10-11 MED ORDER — ZOLPIDEM TARTRATE ER 6.25 MG PO TBCR: 6.2500 mg | EXTENDED_RELEASE_TABLET | Freq: Every evening | ORAL | 5 refills | Status: DC | PRN

## 2020-10-11 MED ORDER — FUROSEMIDE 20 MG PO TABS: 40.0000 mg | ORAL_TABLET | Freq: Every day | ORAL | 5 refills | Status: DC | PRN
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
## 2020-10-11 NOTE — Patient Instructions (Addendum)
[facility] in [place] anxiety/mood[facility]sleep and therapy [phone].  11/2020 colonoscopy due Dr. Jonathon Bellows [phone]

## 2020-10-12 LAB — IRON,TIBC AND FERRITIN PANEL
%SAT: 16 % (calc) (ref 16–45)
Ferritin: 17 ng/mL (ref 16–232)
Iron: 52 ug/dL (ref 45–160)
TIBC: 330 mcg/dL (calc) (ref 250–450)

## 2020-10-13 ENCOUNTER — Encounter: Payer: Self-pay | Admitting: Obstetrics and Gynecology

## 2020-10-13 ENCOUNTER — Other Ambulatory Visit: Payer: Self-pay

## 2020-10-13 ENCOUNTER — Ambulatory Visit: Payer: BC Managed Care – PPO | Admitting: Obstetrics and Gynecology

## 2020-10-13 VITALS — BP 136/82 | Ht 64.0 in | Wt 236.0 lb

## 2020-10-13 DIAGNOSIS — N809 Endometriosis, unspecified: Secondary | ICD-10-CM

## 2020-10-13 DIAGNOSIS — E8941 Symptomatic postprocedural ovarian failure: Secondary | ICD-10-CM | POA: Diagnosis not present

## 2020-10-13 DIAGNOSIS — N802 Endometriosis of fallopian tube: Secondary | ICD-10-CM | POA: Diagnosis not present

## 2020-10-13 DIAGNOSIS — Z90710 Acquired absence of both cervix and uterus: Secondary | ICD-10-CM | POA: Diagnosis not present

## 2020-10-13 DIAGNOSIS — Z780 Asymptomatic menopausal state: Secondary | ICD-10-CM

## 2020-10-13 DIAGNOSIS — N951 Menopausal and female climacteric states: Secondary | ICD-10-CM

## 2020-10-13 DIAGNOSIS — R102 Pelvic and perineal pain: Secondary | ICD-10-CM
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
## 2020-10-14 LAB — FOLLICLE STIMULATING HORMONE: FSH: 75.2 m[IU]/mL

## 2020-10-14 LAB — ESTRADIOL: Estradiol: 14.6 pg/mL

## 2020-10-15 LAB — URINE CULTURE

## 2020-10-20 ENCOUNTER — Ambulatory Visit: Payer: BC Managed Care – PPO | Admitting: Obstetrics and Gynecology

## 2020-10-20 ENCOUNTER — Ambulatory Visit: Payer: BC Managed Care – PPO

## 2020-10-20 DIAGNOSIS — R102 Pelvic and perineal pain: Secondary | ICD-10-CM

## 2020-10-26 ENCOUNTER — Other Ambulatory Visit: Payer: Self-pay

## 2020-10-26 ENCOUNTER — Ambulatory Visit: Payer: BC Managed Care – PPO | Admitting: Podiatry

## 2020-10-26 ENCOUNTER — Encounter: Payer: Self-pay | Admitting: *Deleted

## 2020-10-26 ENCOUNTER — Encounter: Payer: Self-pay | Admitting: Podiatry

## 2020-10-26 DIAGNOSIS — M7752 Other enthesopathy of left foot: Secondary | ICD-10-CM

## 2020-10-26 DIAGNOSIS — S99922A Unspecified injury of left foot, initial encounter: Secondary | ICD-10-CM
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
## 2020-10-27 ENCOUNTER — Encounter: Payer: Self-pay | Admitting: Internal Medicine

## 2020-10-27 ENCOUNTER — Telehealth (INDEPENDENT_AMBULATORY_CARE_PROVIDER_SITE_OTHER): Payer: BC Managed Care – PPO | Admitting: Internal Medicine

## 2020-10-27 ENCOUNTER — Other Ambulatory Visit: Payer: Self-pay

## 2020-10-27 VITALS — Ht 64.0 in | Wt 234.0 lb

## 2020-10-27 DIAGNOSIS — R0982 Postnasal drip: Secondary | ICD-10-CM | POA: Diagnosis not present

## 2020-10-27 DIAGNOSIS — J029 Acute pharyngitis, unspecified: Secondary | ICD-10-CM

## 2020-10-27 DIAGNOSIS — J329 Chronic sinusitis, unspecified: Secondary | ICD-10-CM

## 2020-10-27 DIAGNOSIS — R059 Cough, unspecified: Secondary | ICD-10-CM | POA: Diagnosis not present

## 2020-10-27 MED ORDER — DM-GUAIFENESIN ER 60-1200 MG PO TB12: 1.0000 | ORAL_TABLET | Freq: Two times a day (BID) | ORAL | 0 refills | Status: DC

## 2020-10-27 MED ORDER — GUAIFENESIN-CODEINE 100-10 MG/5ML PO SOLN: 5.0000 mL | Freq: Every evening | ORAL | 0 refills | Status: DC | PRN

## 2020-10-27 MED ORDER — AMOXICILLIN-POT CLAVULANATE 875-125 MG PO TABS: 1.0000 | ORAL_TABLET | Freq: Two times a day (BID) | ORAL | 0 refills | Status: DC

## 2020-10-27 MED ORDER — CETIRIZINE HCL 10 MG PO TABS: 10.0000 mg | ORAL_TABLET | Freq: Every day | ORAL | 3 refills | Status: DC

## 2020-10-27 MED ORDER — SALINE SPRAY 0.65 % NA SOLN: 1.0000 | NASAL | 0 refills | Status: DC | PRN

## 2020-10-27 MED ORDER — CEPACOL SORE THROAT 5.4 MG MT LOZG: 1.0000 | LOZENGE | Freq: Two times a day (BID) | OROMUCOSAL | 0 refills | Status: DC | PRN
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
## 2020-10-27 NOTE — Progress Notes (Signed)
Symptoms started a few days ago, congestion, sore throat, sinus blockage, cough, chest pain with cough. Productive cough with yellowe phlegm.   Patient states that her right tonsil is swollen with pieces of debris/food coming out when she pushes on it.

## 2020-10-27 NOTE — Patient Instructions (Addendum)
Could be tonsil stones review web md or mayo   If covid 19  There is no medication other than over the counter meds:  Mucinex dm green label for cough.  Vitamin C 1000 mg daily.  Vitamin D3 4000 Iu (units) daily.  Zinc 100 mg daily.  Quercetin 250-500 mg 2 times per day   Elderberry  Oil of oregano  cepacol or chloroseptic spray for sore throat  Warm salt water gargles with hydrogen peroxide Warm tea with honey and lemon  Hydration  Try to eat though you dont feel like it   Tylenol or Advil  Nasal saline  Flonase  Zyrtec  Monitor pulse oximeter, buy from [place] if oxygen is less than 90 please go to the hospital.        Are you feeling really sick? Shortness of breath, cough, chest pain?, dizziness? Confusion   If so let me know  If worsening, go to hospital or [facility] clinic Urgent care for further treatment     Sore Throat A sore throat is pain, burning, irritation, or scratchiness in the throat. When you have a sore throat, you may feel pain or tenderness in your throat when you swallow or talk. Many things can cause a sore throat, including:  An infection.  Seasonal allergies.  Dryness in the air.  Irritants, such as smoke or pollution.  Radiation treatment to the area.  Gastroesophageal reflux disease (GERD).  A tumor. A sore throat is often the first sign of another sickness. It may happen with other symptoms, such as coughing, sneezing, fever, and swollen neck glands. Most sore throats go away without medical treatment. Follow these instructions at home:  Take over-the-counter medicines only as told by your health care provider. ? If your child has a sore throat, do not give your child aspirin because of the association with Reye syndrome.  Drink enough fluids to keep your urine pale yellow.  Rest as needed.  To help with pain, try: ? Sipping warm liquids, such as broth, herbal tea, or warm water. ? Eating or drinking cold or frozen liquids, such as  frozen ice pops. ? Gargling with a salt-water mixture 3-4 times a day or as needed. To make a salt-water mixture, completely dissolve -1 tsp (3-6 g) of salt in 1 cup (237 mL) of warm water. ? Sucking on hard candy or throat lozenges. ? Putting a cool-mist humidifier in your bedroom at night to moisten the air. ? Sitting in the bathroom with the door closed for 5-10 minutes while you run hot water in the shower.  Do not use any products that contain nicotine or tobacco, such as cigarettes, e-cigarettes, and chewing tobacco. If you need help quitting, ask your health care provider.  Wash your hands well and often with soap and water. If soap and water are not available, use hand sanitizer.      Contact a health care provider if:  You have a fever for more than 2-3 days.  You have symptoms that last (are persistent) for more than 2-3 days.  Your throat does not get better within 7 days.  You have a fever and your symptoms suddenly get worse.  Your child who is 3 months to 59 years old has a temperature of 102.53F (39C) or higher. Get help right away if:  You have difficulty breathing.  You cannot swallow fluids, soft foods, or your saliva.  You have increased swelling in your throat or neck.  You have persistent nausea  and vomiting. Summary  A sore throat is pain, burning, irritation, or scratchiness in the throat. Many things can cause a sore throat.  Take over-the-counter medicines only as told by your health care provider. Do not give your child aspirin.  Drink plenty of fluids, and rest as needed.  Contact a health care provider if your symptoms worsen or your sore throat does not get better within 7 days. This information is not intended to replace advice given to you by your health care provider. Make sure you discuss any questions you have with your health care provider. Document Revised: 01/26/2018 Document Reviewed: 01/26/2018 Elsevier Patient Education  2021  [place].  Tonsillitis  Tonsillitis is an infection of the throat that causes the tonsils to become red, tender, and swollen. Tonsils are tissues in the back of your throat. Each tonsil has crevices (crypts). Tonsils normally work to protect the body from infection. What are the causes? Sudden (acute) tonsillitis may be caused by a virus or bacteria, including streptococcal bacteria. Long-lasting (chronic) tonsillitis occurs when the crypts of the tonsils become filled with pieces of food and bacteria, which makes it easy for the tonsils to become repeatedly infected. Tonsillitis can be spread from person to person (is contagious). It may be spread by inhaling droplets that are released with coughing or sneezing. You may also come into contact with viruses or bacteria on surfaces, such as cups or utensils. What are the signs or symptoms? Symptoms of this condition include:  A sore throat. This may include trouble swallowing.  White patches on the tonsils.  Swollen tonsils.  Fever.  Headache.  Tiredness.  Loss of appetite.  Snoring during sleep when you did not snore before.  Small, foul-smelling, yellowish-white pieces of material (tonsilloliths) that you occasionally cough up or spit out. These can cause you to have bad breath. How is this diagnosed? This condition is diagnosed with a physical exam. Diagnosis can be confirmed with the results of lab tests, including a throat culture. How is this treated? Treatment for this condition depends on the cause, but usually focuses on treating the symptoms associated with it. Treatment may include:  Medicines to relieve pain and manage fever.  Steroid medicines to reduce swelling.  Antibiotic medicines if the condition is caused by bacteria. If attacks of tonsillitis are severe and frequent, your health care provider may recommend surgery to remove the tonsils (tonsillectomy). Follow these instructions at home: Medicines  Take  over-the-counter and prescription medicines only as told by your health care provider.  If you were prescribed an antibiotic medicine, take it as told by your health care provider. Do not stop taking the antibiotic even if you start to feel better. Eating and drinking  Drink enough fluid to keep your urine clear or pale yellow.  While your throat is sore, eat soft or liquid foods, such as sherbet, soups, or instant breakfast drinks.  Drink warm liquids.  Eat frozen ice pops. General instructions  Rest as much as possible and get plenty of sleep.  Gargle with a salt-water mixture 3-4 times a day or as needed. To make a salt-water mixture, completely dissolve -1 tsp of salt in 1 cup of warm water.  Wash your hands regularly with soap and water. If soap and water are not available, use hand sanitizer.  Do not share cups, bottles, or other utensils until your symptoms have gone away.  Do not smoke. This can help your symptoms and prevent the infection from coming back.  If you need help quitting, ask your health care provider.  Keep all follow-up visits as told by your health care provider. This is important. Contact a health care provider if:  You notice large, tender lumps in your neck that were not there before.  You have a fever that does not go away after 2-3 days.  You develop a rash.  You cough up a green, yellow-brown, or bloody substance.  You cannot swallow liquids or food for 24 hours.  Only one of your tonsils is swollen. Get help right away if:  You develop any new symptoms, such as vomiting, severe headache, stiff neck, chest pain, trouble breathing, or trouble swallowing.  You have severe throat pain along with drooling or voice changes.  You have severe pain that is not controlled with medicines.  You cannot fully open your mouth.  You develop redness, swelling, or severe pain anywhere in your neck. Summary  Tonsillitis is an infection of the throat that  causes the tonsils to become red, tender, and swollen.  Tonsillitis may be caused by a virus or bacteria.  Rest as much as possible. Get plenty of sleep. This information is not intended to replace advice given to you by your health care provider. Make sure you discuss any questions you have with your health care provider. Document Revised: 08/08/2017 Document Reviewed: 10/01/2016 Elsevier Patient Education  2021 [employer].

## 2020-11-04 ENCOUNTER — Other Ambulatory Visit: Payer: Self-pay | Admitting: Internal Medicine

## 2020-11-04 DIAGNOSIS — M62838 Other muscle spasm: Secondary | ICD-10-CM

## 2020-11-04 DIAGNOSIS — M5416 Radiculopathy, lumbar region: Secondary | ICD-10-CM

## 2020-11-05 ENCOUNTER — Ambulatory Visit
Admission: RE | Admit: 2020-11-05 | Discharge: 2020-11-05 | Disposition: A | Discharge status: Home / Self Care | Payer: BC Managed Care – PPO | Source: Ambulatory Visit | Attending: Podiatry | Admitting: Podiatry

## 2020-11-05 ENCOUNTER — Other Ambulatory Visit: Payer: Self-pay

## 2020-11-05 DIAGNOSIS — S99922A Unspecified injury of left foot, initial encounter: Secondary | ICD-10-CM

## 2020-11-06 ENCOUNTER — Other Ambulatory Visit: Payer: Self-pay | Admitting: Internal Medicine

## 2020-11-06 DIAGNOSIS — G4700 Insomnia, unspecified: Secondary | ICD-10-CM

## 2020-11-06 DIAGNOSIS — R059 Cough, unspecified: Secondary | ICD-10-CM

## 2020-11-16 ENCOUNTER — Encounter: Payer: Self-pay | Admitting: *Deleted

## 2020-11-16 ENCOUNTER — Encounter: Payer: Self-pay | Admitting: Podiatry

## 2020-11-16 ENCOUNTER — Other Ambulatory Visit: Payer: Self-pay

## 2020-11-16 ENCOUNTER — Ambulatory Visit (INDEPENDENT_AMBULATORY_CARE_PROVIDER_SITE_OTHER): Payer: BC Managed Care – PPO | Admitting: Podiatry

## 2020-11-16 DIAGNOSIS — Z01818 Encounter for other preprocedural examination: Secondary | ICD-10-CM

## 2020-11-16 DIAGNOSIS — M7672 Peroneal tendinitis, left leg: Secondary | ICD-10-CM
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
## 2020-11-17 ENCOUNTER — Telehealth: Payer: Self-pay

## 2020-11-17 ENCOUNTER — Other Ambulatory Visit: Payer: Self-pay | Admitting: Podiatry

## 2020-11-17 DIAGNOSIS — M79676 Pain in unspecified toe(s): Secondary | ICD-10-CM

## 2020-11-17 MED ORDER — MELOXICAM 15 MG PO TABS: 15.0000 mg | ORAL_TABLET | Freq: Every day | ORAL | 0 refills | Status: DC

## 2020-11-17 NOTE — Telephone Encounter (Signed)
Patient called and stated that you were going to call in pain medication for her but her pharmacy never received it.  Please advise

## 2020-11-17 NOTE — Telephone Encounter (Signed)
Sent mobic 

## 2020-11-20 ENCOUNTER — Telehealth: Payer: Self-pay

## 2020-11-20 NOTE — Telephone Encounter (Signed)
Patient called and stated that the Meloxicam along with Tylenol, ice, heat, soaking is not helping with the pain.  She says "it feels like a bad toothache."  She wants to know if there is something else you can give her to help with pain   She is scheduled for surgery with on on 12/11/20   Please advise

## 2020-11-21 MED ORDER — ACETAMINOPHEN-CODEINE #3 300-30 MG PO TABS: 1.0000 | ORAL_TABLET | ORAL | 0 refills | Status: DC | PRN

## 2020-11-21 NOTE — Telephone Encounter (Signed)
I sent her Tylenol #3

## 2020-11-21 NOTE — Telephone Encounter (Signed)
Patient notified of medication

## 2020-11-22 ENCOUNTER — Telehealth: Payer: Self-pay | Admitting: Urology

## 2020-11-22 NOTE — Telephone Encounter (Signed)
DOS: 12/11/20  REPAIR POST TIBAL TENDON LEFT -- [id number]   SPOKE WITH TENNIE WITH [employer] SHE STATED THAT NO PRIOR AUTH IS NEEDED FOR CPT CODE 45038.   REF# [id number]

## 2020-12-11 ENCOUNTER — Other Ambulatory Visit: Payer: Self-pay | Admitting: Podiatry

## 2020-12-11 DIAGNOSIS — M65872 Other synovitis and tenosynovitis, left ankle and foot: Secondary | ICD-10-CM | POA: Diagnosis not present

## 2020-12-11 MED ORDER — IBUPROFEN 800 MG PO TABS: 800.0000 mg | ORAL_TABLET | Freq: Four times a day (QID) | ORAL | 1 refills | Status: DC | PRN

## 2020-12-11 MED ORDER — OXYCODONE-ACETAMINOPHEN 5-325 MG PO TABS: 1.0000 | ORAL_TABLET | ORAL | 0 refills | Status: DC | PRN

## 2020-12-14 ENCOUNTER — Other Ambulatory Visit: Payer: Self-pay | Admitting: Podiatry

## 2020-12-14 NOTE — Telephone Encounter (Signed)
Please advise 

## 2020-12-19 ENCOUNTER — Encounter: Payer: Self-pay | Admitting: Podiatry

## 2020-12-19 ENCOUNTER — Ambulatory Visit (INDEPENDENT_AMBULATORY_CARE_PROVIDER_SITE_OTHER): Payer: BC Managed Care – PPO | Admitting: Podiatry

## 2020-12-19 ENCOUNTER — Other Ambulatory Visit: Payer: Self-pay

## 2020-12-19 VITALS — BP 152/86 | HR 89 | Temp 99.0°F

## 2020-12-19 DIAGNOSIS — Z9889 Other specified postprocedural states: Secondary | ICD-10-CM

## 2020-12-19 DIAGNOSIS — M7672 Peroneal tendinitis, left leg: Secondary | ICD-10-CM

## 2020-12-19 MED ORDER — OXYCODONE-ACETAMINOPHEN 5-325 MG PO TABS: 1.0000 | ORAL_TABLET | ORAL | 0 refills | Status: DC | PRN

## 2020-12-20 ENCOUNTER — Encounter: Payer: Self-pay | Admitting: Podiatry
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
## 2020-12-25 ENCOUNTER — Encounter: Payer: Self-pay | Admitting: Podiatry

## 2020-12-29 ENCOUNTER — Ambulatory Visit: Payer: BC Managed Care – PPO | Admitting: Internal Medicine

## 2021-01-02 ENCOUNTER — Encounter: Payer: BC Managed Care – PPO | Admitting: Podiatry

## 2021-01-04 ENCOUNTER — Encounter: Payer: BC Managed Care – PPO | Admitting: Podiatry

## 2021-01-09 ENCOUNTER — Encounter: Payer: Self-pay | Admitting: Podiatry

## 2021-01-09 ENCOUNTER — Ambulatory Visit (INDEPENDENT_AMBULATORY_CARE_PROVIDER_SITE_OTHER): Payer: BC Managed Care – PPO | Admitting: Podiatry

## 2021-01-09 ENCOUNTER — Other Ambulatory Visit: Payer: Self-pay

## 2021-01-09 DIAGNOSIS — M7672 Peroneal tendinitis, left leg: Secondary | ICD-10-CM

## 2021-01-09 DIAGNOSIS — Z9889 Other specified postprocedural states: Secondary | ICD-10-CM

## 2021-01-09 MED ORDER — OXYCODONE-ACETAMINOPHEN 5-325 MG PO TABS: 1.0000 | ORAL_TABLET | ORAL | 0 refills | Status: DC | PRN
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
## 2021-01-15 ENCOUNTER — Other Ambulatory Visit: Payer: Self-pay | Admitting: Podiatry

## 2021-01-16 NOTE — Telephone Encounter (Signed)
Please advise 

## 2021-02-20 ENCOUNTER — Other Ambulatory Visit: Payer: Self-pay

## 2021-02-20 ENCOUNTER — Ambulatory Visit (INDEPENDENT_AMBULATORY_CARE_PROVIDER_SITE_OTHER): Payer: BC Managed Care – PPO | Admitting: Podiatry

## 2021-02-20 ENCOUNTER — Encounter: Payer: Self-pay | Admitting: Podiatry

## 2021-02-20 DIAGNOSIS — Q666 Other congenital valgus deformities of feet: Secondary | ICD-10-CM | POA: Diagnosis not present

## 2021-02-20 DIAGNOSIS — Z9889 Other specified postprocedural states: Secondary | ICD-10-CM

## 2021-02-20 DIAGNOSIS — M7672 Peroneal tendinitis, left leg: Secondary | ICD-10-CM
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
## 2021-02-21 ENCOUNTER — Telehealth: Payer: Self-pay

## 2021-02-21 NOTE — Telephone Encounter (Signed)
Pt would like a new referral to [facility], as she has misplaced the one she was given.

## 2021-02-21 NOTE — Telephone Encounter (Signed)
Dr. Posey Pronto, let me know the exact Dx and instructions for PT and I will fax a new script to [facility] pt for this patient.  thanks

## 2021-02-22 NOTE — Telephone Encounter (Signed)
PT form faxed to [facility].

## 2021-02-26 ENCOUNTER — Ambulatory Visit (INDEPENDENT_AMBULATORY_CARE_PROVIDER_SITE_OTHER): Payer: BC Managed Care – PPO

## 2021-02-26 ENCOUNTER — Other Ambulatory Visit: Payer: Self-pay

## 2021-02-26 DIAGNOSIS — Q666 Other congenital valgus deformities of feet: Secondary | ICD-10-CM

## 2021-02-26 NOTE — Progress Notes (Signed)
Patient presents today to be casted for custom molded orthotics. Dr. Posey Pronto  has been treating patient for pes planovalgus.   Impression foam cast was taken.   Patient info-  Shoe size: 10 wide  Shoe style: tennis shoes  Height:   Weight: 234 lbs    Patient will be notified once orthotics arrive in office and reappoint for fitting at that time.

## 2021-03-20 ENCOUNTER — Telehealth (INDEPENDENT_AMBULATORY_CARE_PROVIDER_SITE_OTHER): Payer: BC Managed Care – PPO | Admitting: Internal Medicine

## 2021-03-20 ENCOUNTER — Other Ambulatory Visit: Payer: Self-pay

## 2021-03-20 ENCOUNTER — Encounter: Payer: Self-pay | Admitting: Internal Medicine

## 2021-03-20 ENCOUNTER — Encounter (INDEPENDENT_AMBULATORY_CARE_PROVIDER_SITE_OTHER): Payer: Self-pay

## 2021-03-20 ENCOUNTER — Other Ambulatory Visit: Payer: Self-pay | Admitting: Podiatry

## 2021-03-20 VITALS — Ht 64.0 in | Wt 227.0 lb

## 2021-03-20 DIAGNOSIS — R509 Fever, unspecified: Secondary | ICD-10-CM

## 2021-03-20 DIAGNOSIS — R0989 Other specified symptoms and signs involving the circulatory and respiratory systems: Secondary | ICD-10-CM

## 2021-03-20 DIAGNOSIS — H9209 Otalgia, unspecified ear: Secondary | ICD-10-CM

## 2021-03-20 DIAGNOSIS — J029 Acute pharyngitis, unspecified: Secondary | ICD-10-CM | POA: Diagnosis not present

## 2021-03-20 DIAGNOSIS — R0982 Postnasal drip: Secondary | ICD-10-CM

## 2021-03-20 DIAGNOSIS — R5383 Other fatigue: Secondary | ICD-10-CM

## 2021-03-20 DIAGNOSIS — R059 Cough, unspecified: Secondary | ICD-10-CM

## 2021-03-20 DIAGNOSIS — H1033 Unspecified acute conjunctivitis, bilateral: Secondary | ICD-10-CM

## 2021-03-20 DIAGNOSIS — J329 Chronic sinusitis, unspecified: Secondary | ICD-10-CM

## 2021-03-20 MED ORDER — PREDNISONE 10 MG PO TABS: 10.0000 mg | ORAL_TABLET | Freq: Every day | ORAL | 0 refills | Status: DC

## 2021-03-20 MED ORDER — CETIRIZINE HCL 10 MG PO TABS: 10.0000 mg | ORAL_TABLET | Freq: Every day | ORAL | 3 refills | Status: DC

## 2021-03-20 MED ORDER — OLOPATADINE HCL 0.2 % OP SOLN: 1.0000 [drp] | Freq: Every day | OPHTHALMIC | 3 refills | Status: DC

## 2021-03-20 MED ORDER — ERYTHROMYCIN 5 MG/GM OP OINT: 1.0000 "application " | TOPICAL_OINTMENT | Freq: Every day | OPHTHALMIC | 0 refills | Status: DC

## 2021-03-20 MED ORDER — FLUTICASONE PROPIONATE 50 MCG/ACT NA SUSP: 2.0000 | Freq: Every day | NASAL | 12 refills | Status: DC

## 2021-03-20 MED ORDER — SALINE SPRAY 0.65 % NA SOLN
1.0000 | NASAL | 11 refills | Status: DC | PRN
Start: 2021-03-20 — End: 2021-08-28

## 2021-03-20 MED ORDER — AMOXICILLIN-POT CLAVULANATE 875-125 MG PO TABS: 1.0000 | ORAL_TABLET | Freq: Two times a day (BID) | ORAL | 0 refills | Status: DC

## 2021-03-20 NOTE — Progress Notes (Signed)
Patient having eye drainage, sore throat, ear ache, headache. No one around the Patient is sick.
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
## 2021-03-22 ENCOUNTER — Other Ambulatory Visit: Payer: Self-pay | Admitting: Podiatry

## 2021-03-22 IMAGING — MR MR ANKLE*L* W/O CM
4 of 6 series · 14 of 40 positions shown · non-contrast
Comparison: Plain films left ankle 08/08/2020.

CLINICAL DATA: Left ankle pain since a fall and twisting injury
08/06/2020.

EXAM:
MRI OF THE LEFT ANKLE WITHOUT CONTRAST
TECHNIQUE: Multiplanar, multisequence MR imaging of the ankle was performed. No
intravenous contrast was administered.

[Series 3: PD fat-sat · axial · left · 3.0mm · 0.25mm/px · z∈[-7,+89]mm · 5 of 30 slices shown]
[im 1/30]
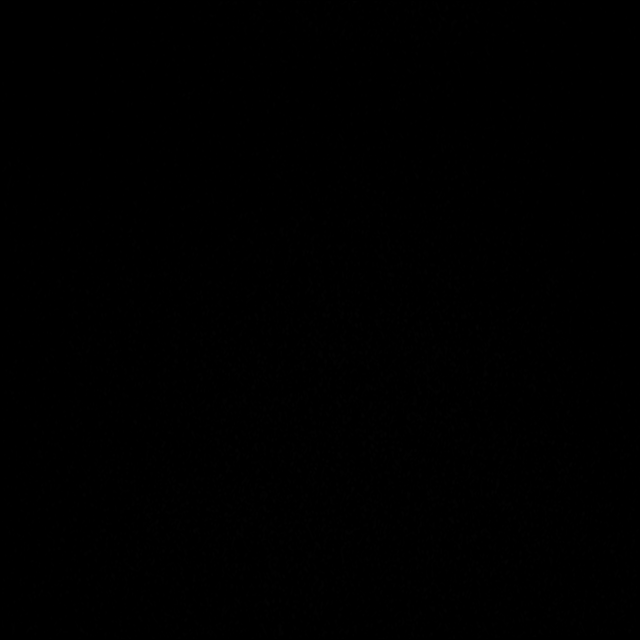
[im 5/30]
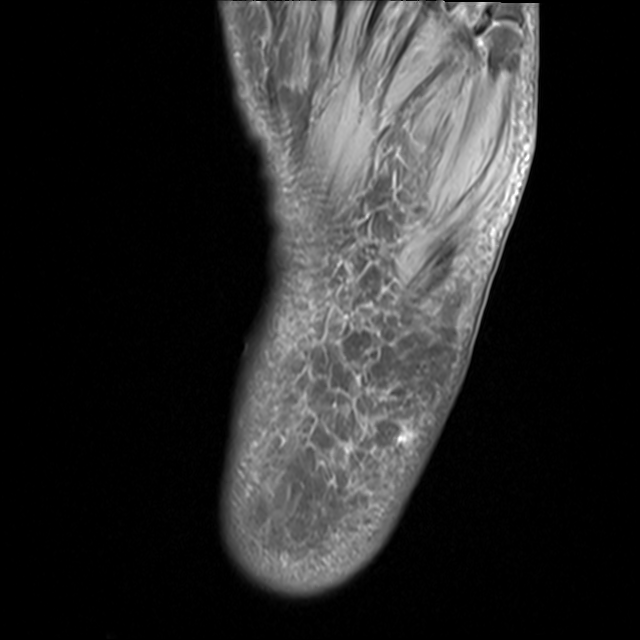
[im 9/30]
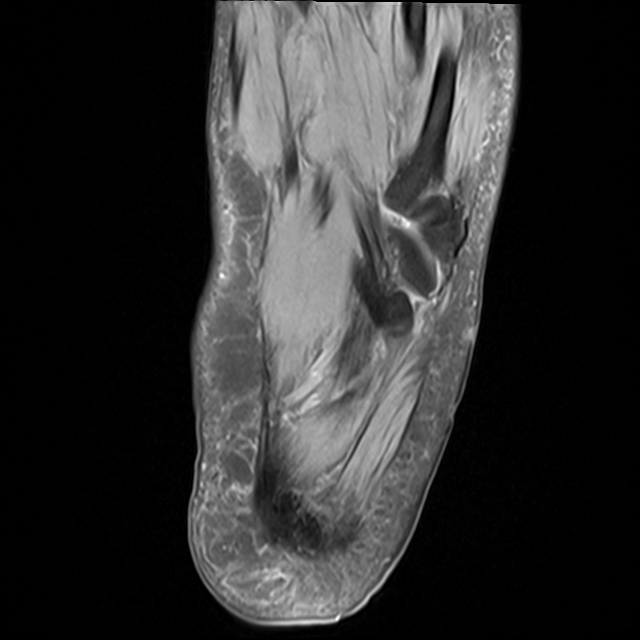
[im 17/30]
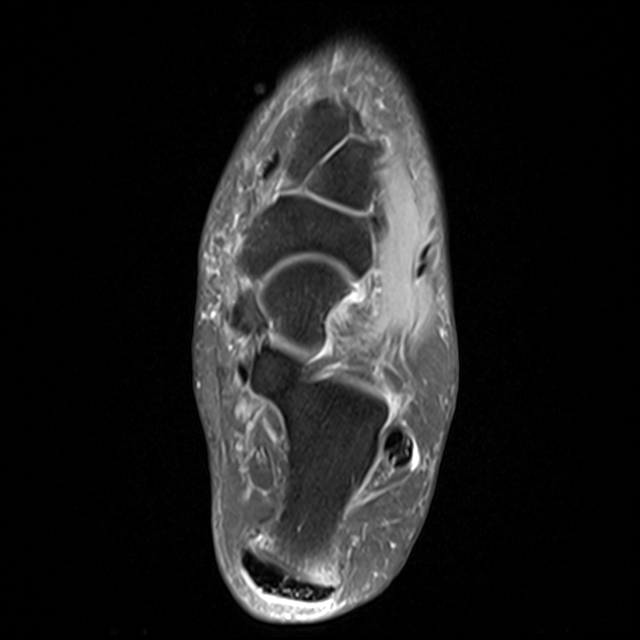
[im 25/30]
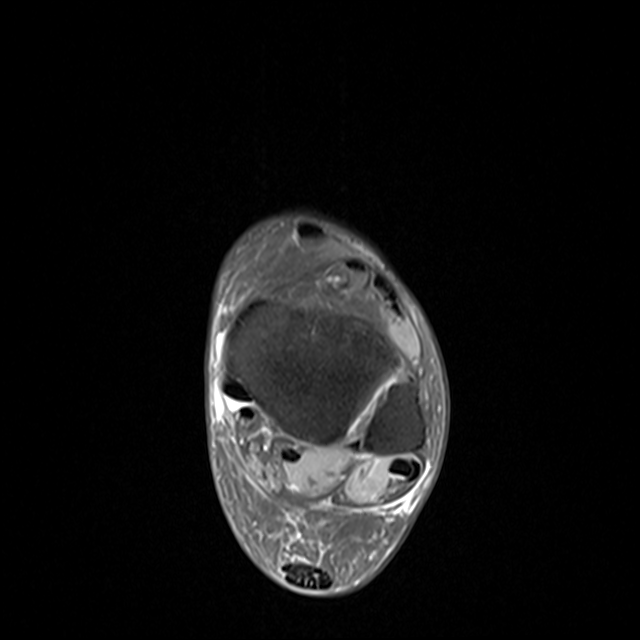

[Series 4: T2 fat-sat · axial · left · 3.0mm · 0.25mm/px · z∈[+9,+89]mm · 3 of 30 slices shown (1 of 2)]
[im 5/30]
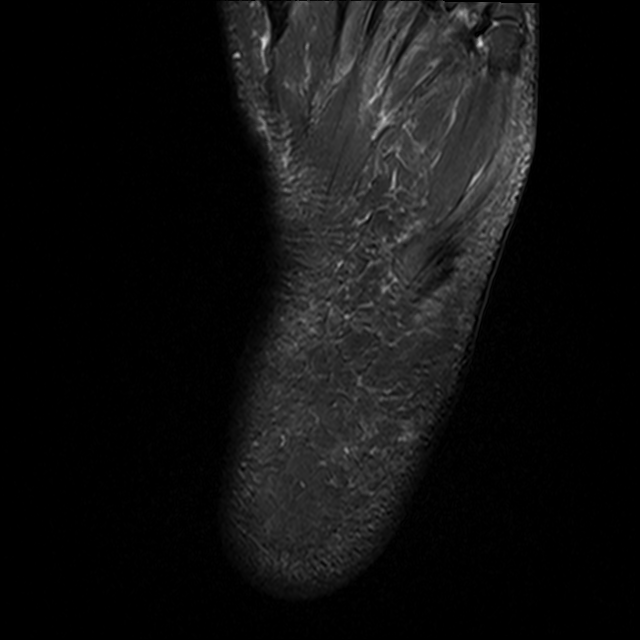
[im 15/30]
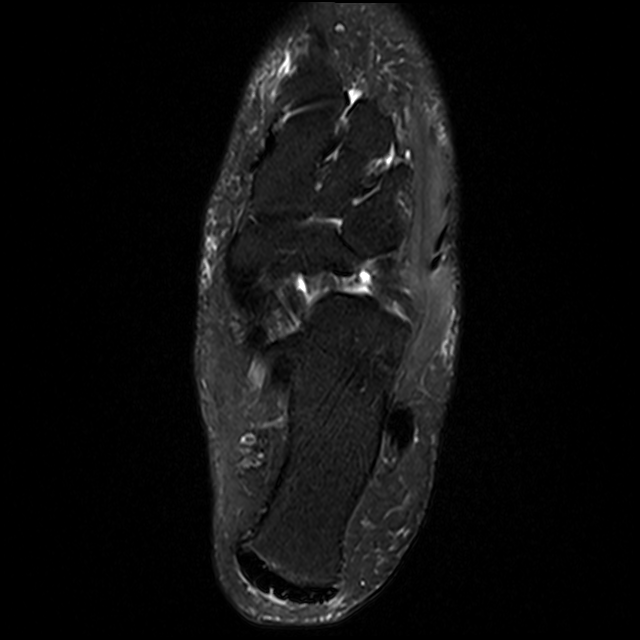
[im 25/30]
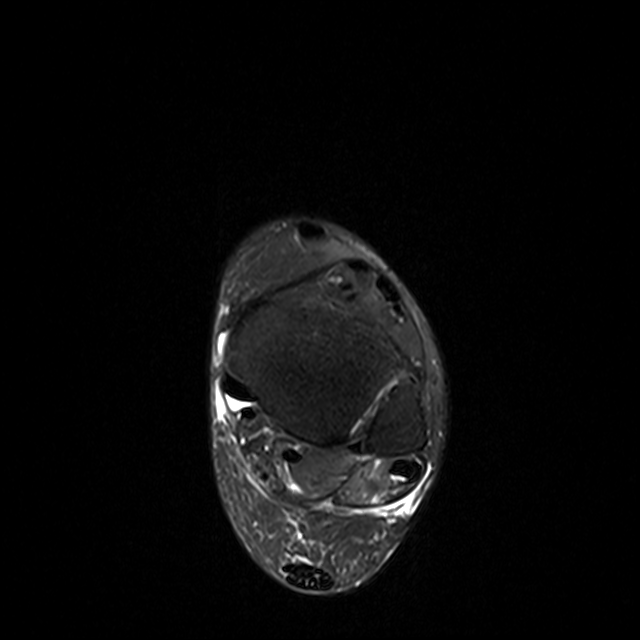

[Series 5: T1 · axial · left · 3.0mm · 0.31mm/px · z∈[+8,+90]mm · 3 of 33 slices shown]
[im 6/33]
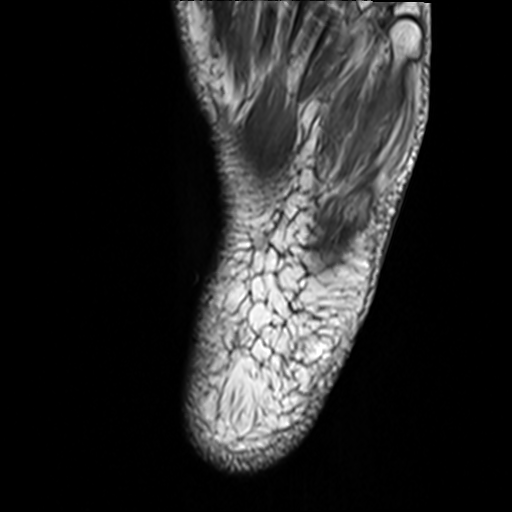
[im 17/33]
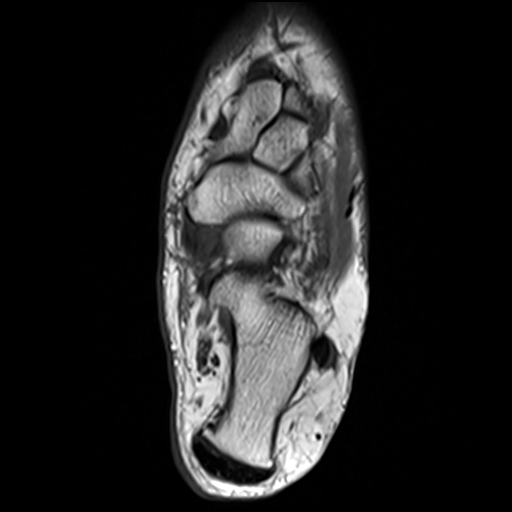
[im 27/33]
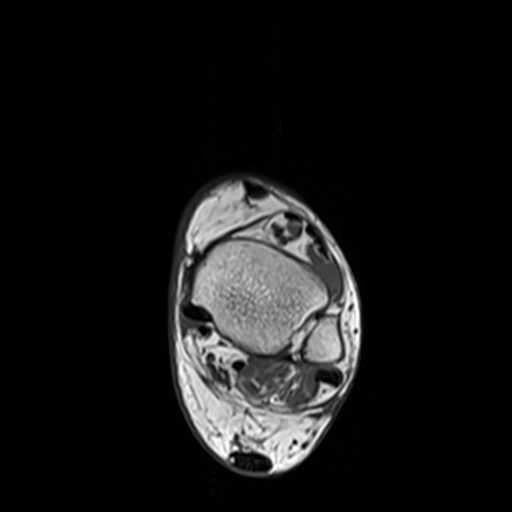

[Series 8: T2 fat-sat · coronal · left · 3.0mm · 0.25mm/px · 3 of 37 slices shown (2 of 2)]
[im 6/37]
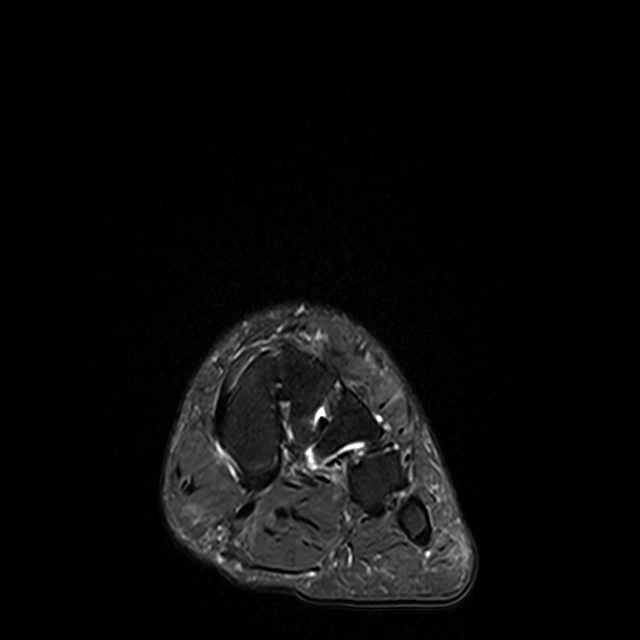
[im 21/37]
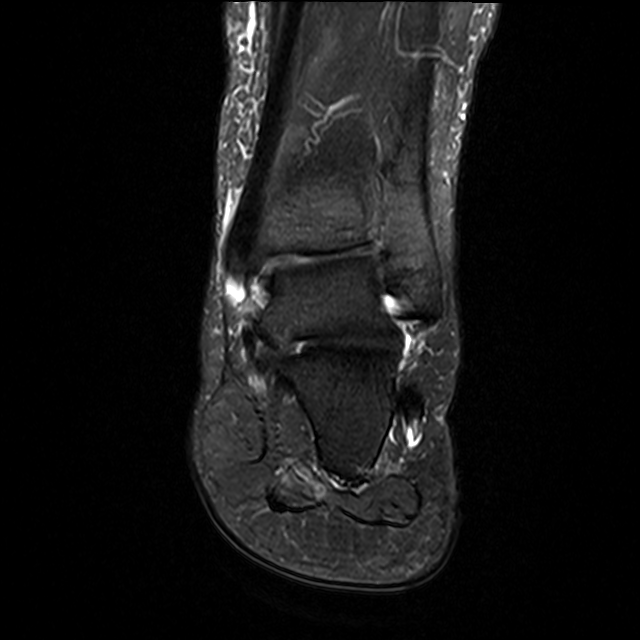
[im 31/37]
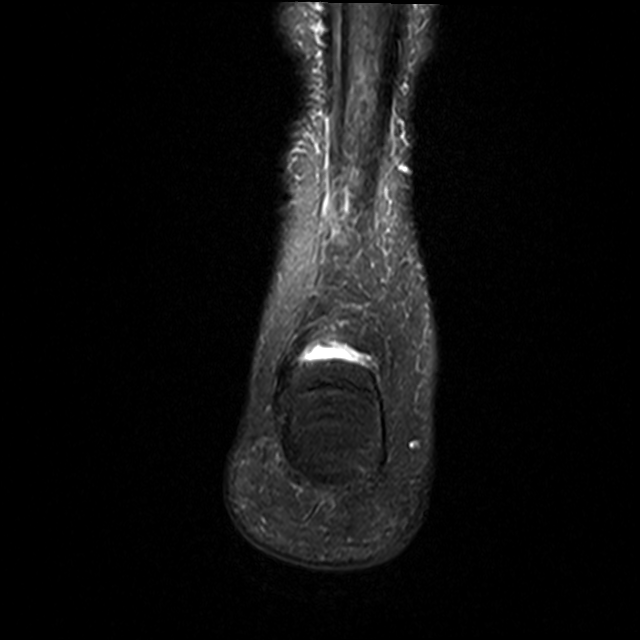

[14 of 40 positions shown; findings below may reference images not displayed]

FINDINGS: TENDONS

Peroneal: The patient has peroneus brevis tendinosis. Longitudinal
split tearing is seen in the tendon at and just inferior to the
lateral malleolus. The tendon is markedly attenuated proximal to the
calcaneocuboid joint consistent with high-grade partial tear. The
peroneus longus is intact. The peroneal retinaculum appears intact.

Posteromedial: Intact.

Anterior: Intact

Achilles: Intact. There is intrasubstance increased T2 signal in the
distal tendon consistent with tendinosis. There is some fluid in the
retrocalcaneal bursa.

Plantar Fascia: Intact.  No evidence of plantar fasciitis.

LIGAMENTS

Lateral: Intact.

Medial: Intact.

CARTILAGE

Ankle Joint: Normal. No osteochondral lesion of the talar dome or
joint effusion.

Subtalar Joints/Sinus Tarsi: Normal.

Bones: No fracture, stress change or focal lesion.

Other: None.
IMPRESSION: Peroneus brevis tendinosis with longitudinal split tearing of the
tendon inferior to the lateral malleolus and a near complete tear of
the tendon as it passes along the calcaneus. Only a thin strand of
tendon appears reached the insertion on the fifth metatarsal.

Moderate appearing insertional Achilles tendinosis with associated
retrocalcaneal bursitis. The Achilles is intact.

## 2021-03-26 ENCOUNTER — Telehealth: Payer: Self-pay

## 2021-03-26 NOTE — Telephone Encounter (Signed)
Pt called and states that the meloxicam and prednisone and making her stomach upset/diarrhea and wants to know what she should do. Please advise

## 2021-03-26 NOTE — Telephone Encounter (Signed)
Patient has been informed.

## 2021-03-26 NOTE — Telephone Encounter (Signed)
Hold meloxicam for now can take otc prilosec 20 mg 30 minutes before food

## 2021-04-03 ENCOUNTER — Other Ambulatory Visit: Payer: Self-pay | Admitting: Podiatry

## 2021-04-06 DIAGNOSIS — M79676 Pain in unspecified toe(s): Secondary | ICD-10-CM

## 2021-04-09 NOTE — Telephone Encounter (Signed)
Please advise 

## 2021-04-10 ENCOUNTER — Ambulatory Visit: Payer: BC Managed Care – PPO | Admitting: Internal Medicine

## 2021-04-10 ENCOUNTER — Telehealth: Payer: Self-pay | Admitting: Internal Medicine

## 2021-04-10 NOTE — Telephone Encounter (Signed)
Patient no-showed today's appointment; appointment was for 04/10/21 at 7:30 am, provider notified for review of record. Letter sent for patient to call in and re-schedule.

## 2021-04-17 ENCOUNTER — Encounter: Payer: BC Managed Care – PPO | Admitting: Podiatry

## 2021-04-19 ENCOUNTER — Other Ambulatory Visit: Payer: Self-pay | Admitting: Podiatry

## 2021-04-19 NOTE — Telephone Encounter (Signed)
Please advise 

## 2021-04-24 ENCOUNTER — Ambulatory Visit (INDEPENDENT_AMBULATORY_CARE_PROVIDER_SITE_OTHER): Payer: BC Managed Care – PPO | Admitting: Podiatry

## 2021-04-24 ENCOUNTER — Encounter: Payer: Self-pay | Admitting: Podiatry

## 2021-04-24 ENCOUNTER — Other Ambulatory Visit: Payer: Self-pay

## 2021-04-24 DIAGNOSIS — M7672 Peroneal tendinitis, left leg: Secondary | ICD-10-CM
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
## 2021-05-26 ENCOUNTER — Other Ambulatory Visit: Payer: Self-pay | Admitting: Internal Medicine

## 2021-05-26 DIAGNOSIS — G4700 Insomnia, unspecified: Secondary | ICD-10-CM

## 2021-05-29 ENCOUNTER — Encounter: Payer: Self-pay | Admitting: *Deleted

## 2021-05-29 ENCOUNTER — Ambulatory Visit (INDEPENDENT_AMBULATORY_CARE_PROVIDER_SITE_OTHER): Payer: BC Managed Care – PPO | Admitting: Podiatry

## 2021-05-29 ENCOUNTER — Other Ambulatory Visit: Payer: Self-pay

## 2021-05-29 DIAGNOSIS — M7672 Peroneal tendinitis, left leg: Secondary | ICD-10-CM

## 2021-05-29 MED ORDER — OXYCODONE-ACETAMINOPHEN 5-325 MG PO TABS: 1.0000 | ORAL_TABLET | ORAL | 0 refills | Status: DC | PRN

## 2021-05-30 ENCOUNTER — Encounter: Payer: Self-pay | Admitting: Podiatry
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
## 2021-06-20 ENCOUNTER — Other Ambulatory Visit: Payer: 59

## 2021-06-20 ENCOUNTER — Other Ambulatory Visit: Payer: Self-pay

## 2021-06-26 ENCOUNTER — Encounter: Payer: BC Managed Care – PPO | Admitting: Podiatry

## 2021-07-11 DIAGNOSIS — M79676 Pain in unspecified toe(s): Secondary | ICD-10-CM

## 2021-07-12 ENCOUNTER — Ambulatory Visit: Payer: BC Managed Care – PPO | Admitting: Internal Medicine

## 2021-07-19 ENCOUNTER — Encounter: Payer: BC Managed Care – PPO | Admitting: Podiatry

## 2021-08-28 ENCOUNTER — Ambulatory Visit (INDEPENDENT_AMBULATORY_CARE_PROVIDER_SITE_OTHER): Payer: BC Managed Care – PPO | Admitting: Internal Medicine

## 2021-08-28 ENCOUNTER — Encounter: Payer: Self-pay | Admitting: Internal Medicine

## 2021-08-28 ENCOUNTER — Other Ambulatory Visit: Payer: Self-pay | Admitting: Internal Medicine

## 2021-08-28 ENCOUNTER — Other Ambulatory Visit: Payer: Self-pay

## 2021-08-28 VITALS — BP 128/80 | HR 77 | Temp 97.0°F | Ht 64.0 in | Wt 236.2 lb

## 2021-08-28 DIAGNOSIS — R7303 Prediabetes: Secondary | ICD-10-CM | POA: Diagnosis not present

## 2021-08-28 DIAGNOSIS — F32A Depression, unspecified: Secondary | ICD-10-CM

## 2021-08-28 DIAGNOSIS — E559 Vitamin D deficiency, unspecified: Secondary | ICD-10-CM

## 2021-08-28 DIAGNOSIS — Z23 Encounter for immunization: Secondary | ICD-10-CM

## 2021-08-28 DIAGNOSIS — E538 Deficiency of other specified B group vitamins: Secondary | ICD-10-CM | POA: Diagnosis not present

## 2021-08-28 DIAGNOSIS — H1033 Unspecified acute conjunctivitis, bilateral: Secondary | ICD-10-CM

## 2021-08-28 DIAGNOSIS — J329 Chronic sinusitis, unspecified: Secondary | ICD-10-CM

## 2021-08-28 DIAGNOSIS — E785 Hyperlipidemia, unspecified: Secondary | ICD-10-CM

## 2021-08-28 DIAGNOSIS — E611 Iron deficiency: Secondary | ICD-10-CM

## 2021-08-28 DIAGNOSIS — R350 Frequency of micturition: Secondary | ICD-10-CM

## 2021-08-28 DIAGNOSIS — G4700 Insomnia, unspecified: Secondary | ICD-10-CM | POA: Diagnosis not present

## 2021-08-28 DIAGNOSIS — F419 Anxiety disorder, unspecified: Secondary | ICD-10-CM | POA: Diagnosis not present

## 2021-08-28 DIAGNOSIS — I10 Essential (primary) hypertension: Secondary | ICD-10-CM | POA: Diagnosis not present

## 2021-08-28 DIAGNOSIS — Z1211 Encounter for screening for malignant neoplasm of colon: Secondary | ICD-10-CM

## 2021-08-28 DIAGNOSIS — H04203 Unspecified epiphora, bilateral lacrimal glands: Secondary | ICD-10-CM

## 2021-08-28 DIAGNOSIS — H1013 Acute atopic conjunctivitis, bilateral: Secondary | ICD-10-CM

## 2021-08-28 DIAGNOSIS — Z1231 Encounter for screening mammogram for malignant neoplasm of breast: Secondary | ICD-10-CM

## 2021-08-28 DIAGNOSIS — F5104 Psychophysiologic insomnia: Secondary | ICD-10-CM | POA: Diagnosis not present

## 2021-08-28 DIAGNOSIS — K219 Gastro-esophageal reflux disease without esophagitis: Secondary | ICD-10-CM

## 2021-08-28 DIAGNOSIS — H9313 Tinnitus, bilateral: Secondary | ICD-10-CM

## 2021-08-28 MED ORDER — ZOLPIDEM TARTRATE ER 12.5 MG PO TBCR: 12.5000 mg | EXTENDED_RELEASE_TABLET | Freq: Every evening | ORAL | 5 refills | Status: DC | PRN

## 2021-08-28 MED ORDER — OMEPRAZOLE 40 MG PO CPDR: 40.0000 mg | DELAYED_RELEASE_CAPSULE | Freq: Every day | ORAL | 3 refills | Status: DC

## 2021-08-28 MED ORDER — SERTRALINE HCL 100 MG PO TABS: 150.0000 mg | ORAL_TABLET | Freq: Every day | ORAL | 11 refills | Status: DC

## 2021-08-28 MED ORDER — OLOPATADINE HCL 0.2 % OP SOLN: 1.0000 [drp] | Freq: Every day | OPHTHALMIC | 11 refills | Status: DC

## 2021-08-28 MED ORDER — AMLODIPINE BESYLATE 2.5 MG PO TABS: 2.5000 mg | ORAL_TABLET | Freq: Every day | ORAL | 3 refills | Status: DC

## 2021-08-28 NOTE — Patient Instructions (Signed)
Mucinex dm or robitussim dm  Warm tea with honey and lemon Sugar free lemon cough drops ricola/Halls
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
## 2021-08-29 ENCOUNTER — Other Ambulatory Visit: Payer: Self-pay | Admitting: Internal Medicine

## 2021-08-29 DIAGNOSIS — E559 Vitamin D deficiency, unspecified: Secondary | ICD-10-CM

## 2021-08-29 LAB — LIPID PANEL
Cholesterol: 210 mg/dL — ABNORMAL HIGH (ref 0–200)
HDL: 36.9 mg/dL — ABNORMAL LOW (ref >39.00)
LDL Cholesterol: 155 mg/dL — ABNORMAL HIGH (ref 0–99)
NonHDL: 173.31
Total CHOL/HDL Ratio: 6
Triglycerides: 92 mg/dL (ref 0.0–149.0)
VLDL: 18.4 mg/dL (ref 0.0–40.0)

## 2021-08-29 LAB — CBC WITH DIFFERENTIAL/PLATELET
Basophils Absolute: 0 10*3/uL (ref 0.0–0.1)
Basophils Relative: 0.9 % (ref 0.0–3.0)
Eosinophils Absolute: 0.1 10*3/uL (ref 0.0–0.7)
Eosinophils Relative: 2.2 % (ref 0.0–5.0)
HCT: 37.7 % (ref 36.0–46.0)
Hemoglobin: 12 g/dL (ref 12.0–15.0)
Lymphocytes Relative: 32.3 % (ref 12.0–46.0)
Lymphs Abs: 1.5 10*3/uL (ref 0.7–4.0)
MCHC: 31.8 g/dL (ref 30.0–36.0)
MCV: 89.7 fl (ref 78.0–100.0)
Monocytes Absolute: 0.3 10*3/uL (ref 0.1–1.0)
Monocytes Relative: 6.9 % (ref 3.0–12.0)
Neutro Abs: 2.6 10*3/uL (ref 1.4–7.7)
Neutrophils Relative %: 57.7 % (ref 43.0–77.0)
Platelets: 276 10*3/uL (ref 150.0–400.0)
RBC: 4.21 Mil/uL (ref 3.87–5.11)
RDW: 15.3 % (ref 11.5–15.5)
WBC: 4.5 10*3/uL (ref 4.0–10.5)

## 2021-08-29 LAB — URINE CULTURE
MICRO NUMBER:: 12780110
SPECIMEN QUALITY:: ADEQUATE

## 2021-08-29 LAB — URINALYSIS, ROUTINE W REFLEX MICROSCOPIC
Bilirubin Urine: NEGATIVE
Glucose, UA: NEGATIVE
Hgb urine dipstick: NEGATIVE
Ketones, ur: NEGATIVE
Leukocytes,Ua: NEGATIVE
Nitrite: NEGATIVE
Protein, ur: NEGATIVE
Specific Gravity, Urine: 1.018 (ref 1.001–1.035)
pH: 7 (ref 5.0–8.0)

## 2021-08-29 LAB — HEMOGLOBIN A1C: Hgb A1c MFr Bld: 5.9 % (ref 4.6–6.5)

## 2021-08-29 LAB — COMPREHENSIVE METABOLIC PANEL
ALT: 7 U/L (ref 0–35)
AST: 11 U/L (ref 0–37)
Albumin: 4.2 g/dL (ref 3.5–5.2)
Alkaline Phosphatase: 76 U/L (ref 39–117)
BUN: 10 mg/dL (ref 6–23)
CO2: 30 mEq/L (ref 19–32)
Calcium: 9.7 mg/dL (ref 8.4–10.5)
Chloride: 106 mEq/L (ref 96–112)
Creatinine, Ser: 0.82 mg/dL (ref 0.40–1.20)
GFR: 82.31 mL/min (ref >60.00)
Glucose, Bld: 104 mg/dL — ABNORMAL HIGH (ref 70–99)
Potassium: 4.7 mEq/L (ref 3.5–5.1)
Sodium: 143 mEq/L (ref 135–145)
Total Bilirubin: 0.5 mg/dL (ref 0.2–1.2)
Total Protein: 7 g/dL (ref 6.0–8.3)

## 2021-08-29 LAB — IBC + FERRITIN
Ferritin: 38.4 ng/mL (ref 10.0–291.0)
Iron: 68 ug/dL (ref 42–145)
Saturation Ratios: 17.5 % — ABNORMAL LOW (ref 20.0–50.0)
TIBC: 389.2 ug/dL (ref 250.0–450.0)
Transferrin: 278 mg/dL (ref 212.0–360.0)

## 2021-08-29 LAB — VITAMIN D 25 HYDROXY (VIT D DEFICIENCY, FRACTURES): VITD: 15.16 ng/mL — ABNORMAL LOW (ref 30.00–100.00)

## 2021-08-29 LAB — VITAMIN B12: Vitamin B-12: 406 pg/mL (ref 211–911)

## 2021-08-29 MED ORDER — CHOLECALCIFEROL 1.25 MG (50000 UT) PO CAPS
50000.0000 [IU] | ORAL_CAPSULE | ORAL | 1 refills | Status: DC
Start: 2021-08-29 — End: 2022-03-06

## 2021-08-30 MED ORDER — PRAVASTATIN SODIUM 20 MG PO TABS: 20.0000 mg | ORAL_TABLET | Freq: Every day | ORAL | 3 refills | Status: DC

## 2021-08-30 NOTE — Progress Notes (Signed)
Liver kidneys normal Cholesterol worse  -is she agreeable to statin cholesterol medication?  -rec healthy diet and and exercise  Iron is low -rec multivitamin with iron daily  Vitamin D low  -sent 1x per week vitamin  B12 normal  Blood cts normal  A1c 5.9=prediabetes  Urine normal

## 2021-08-30 NOTE — Addendum Note (Signed)
Addended by: Orland Mustard on: 08/30/2021 06:13 PM   Modules accepted: Orders

## 2021-11-13 ENCOUNTER — Telehealth: Payer: Self-pay | Admitting: Internal Medicine

## 2021-11-13 NOTE — Telephone Encounter (Signed)
Rejection Reason - Patient did not respond - Called pt left voicemail no repsonse" ?Stacey Roberson said on Nov 13, 2021 10:09 AM ? ?Msg from [place] ent  ?

## 2022-03-05 ENCOUNTER — Ambulatory Visit: Payer: BC Managed Care – PPO | Admitting: Internal Medicine

## 2022-03-06 ENCOUNTER — Ambulatory Visit (INDEPENDENT_AMBULATORY_CARE_PROVIDER_SITE_OTHER): Payer: Commercial Managed Care - HMO | Admitting: Internal Medicine

## 2022-03-06 ENCOUNTER — Encounter: Payer: Self-pay | Admitting: Internal Medicine

## 2022-03-06 ENCOUNTER — Telehealth: Payer: Self-pay

## 2022-03-06 VITALS — BP 130/80 | HR 84 | Temp 98.3°F | Resp 14 | Ht 64.0 in | Wt 237.4 lb

## 2022-03-06 DIAGNOSIS — R600 Localized edema: Secondary | ICD-10-CM

## 2022-03-06 DIAGNOSIS — M79672 Pain in left foot: Secondary | ICD-10-CM

## 2022-03-06 DIAGNOSIS — Z1211 Encounter for screening for malignant neoplasm of colon: Secondary | ICD-10-CM | POA: Diagnosis not present

## 2022-03-06 DIAGNOSIS — F419 Anxiety disorder, unspecified: Secondary | ICD-10-CM

## 2022-03-06 DIAGNOSIS — F32A Depression, unspecified: Secondary | ICD-10-CM

## 2022-03-06 DIAGNOSIS — M25572 Pain in left ankle and joints of left foot: Secondary | ICD-10-CM

## 2022-03-06 DIAGNOSIS — M5416 Radiculopathy, lumbar region: Secondary | ICD-10-CM

## 2022-03-06 DIAGNOSIS — E559 Vitamin D deficiency, unspecified: Secondary | ICD-10-CM | POA: Diagnosis not present

## 2022-03-06 DIAGNOSIS — Z1231 Encounter for screening mammogram for malignant neoplasm of breast: Secondary | ICD-10-CM | POA: Diagnosis not present

## 2022-03-06 DIAGNOSIS — H1033 Unspecified acute conjunctivitis, bilateral: Secondary | ICD-10-CM

## 2022-03-06 DIAGNOSIS — R7303 Prediabetes: Secondary | ICD-10-CM

## 2022-03-06 DIAGNOSIS — G8929 Other chronic pain: Secondary | ICD-10-CM | POA: Diagnosis not present

## 2022-03-06 DIAGNOSIS — M62838 Other muscle spasm: Secondary | ICD-10-CM

## 2022-03-06 DIAGNOSIS — E611 Iron deficiency: Secondary | ICD-10-CM | POA: Diagnosis not present

## 2022-03-06 DIAGNOSIS — F5104 Psychophysiologic insomnia: Secondary | ICD-10-CM

## 2022-03-06 DIAGNOSIS — I10 Essential (primary) hypertension: Secondary | ICD-10-CM | POA: Diagnosis not present

## 2022-03-06 DIAGNOSIS — H1013 Acute atopic conjunctivitis, bilateral: Secondary | ICD-10-CM

## 2022-03-06 DIAGNOSIS — R0982 Postnasal drip: Secondary | ICD-10-CM

## 2022-03-06 DIAGNOSIS — J329 Chronic sinusitis, unspecified: Secondary | ICD-10-CM

## 2022-03-06 DIAGNOSIS — Z1329 Encounter for screening for other suspected endocrine disorder: Secondary | ICD-10-CM | POA: Diagnosis not present

## 2022-03-06 DIAGNOSIS — K219 Gastro-esophageal reflux disease without esophagitis: Secondary | ICD-10-CM

## 2022-03-06 DIAGNOSIS — E785 Hyperlipidemia, unspecified: Secondary | ICD-10-CM

## 2022-03-06 DIAGNOSIS — G4700 Insomnia, unspecified: Secondary | ICD-10-CM

## 2022-03-06 LAB — COMPREHENSIVE METABOLIC PANEL
ALT: 7 U/L (ref 0–35)
AST: 10 U/L (ref 0–37)
Albumin: 3.4 g/dL — ABNORMAL LOW (ref 3.5–5.2)
Alkaline Phosphatase: 50 U/L (ref 39–117)
BUN: 11 mg/dL (ref 6–23)
CO2: 29 mEq/L (ref 19–32)
Calcium: 8.6 mg/dL (ref 8.4–10.5)
Chloride: 109 mEq/L (ref 96–112)
Creatinine, Ser: 0.93 mg/dL (ref 0.40–1.20)
GFR: 70.51 mL/min (ref >60.00)
Glucose, Bld: 107 mg/dL — ABNORMAL HIGH (ref 70–99)
Potassium: 3.8 mEq/L (ref 3.5–5.1)
Sodium: 142 mEq/L (ref 135–145)
Total Bilirubin: 0.4 mg/dL (ref 0.2–1.2)
Total Protein: 5.3 g/dL — ABNORMAL LOW (ref 6.0–8.3)

## 2022-03-06 LAB — IBC + FERRITIN
Ferritin: 15.9 ng/mL (ref 10.0–291.0)
Iron: 62 ug/dL (ref 42–145)
Saturation Ratios: 18.6 % — ABNORMAL LOW (ref 20.0–50.0)
TIBC: 333.2 ug/dL (ref 250.0–450.0)
Transferrin: 238 mg/dL (ref 212.0–360.0)

## 2022-03-06 LAB — CBC WITH DIFFERENTIAL/PLATELET
Basophils Absolute: 0 10*3/uL (ref 0.0–0.1)
Basophils Relative: 0.8 % (ref 0.0–3.0)
Eosinophils Absolute: 0.1 10*3/uL (ref 0.0–0.7)
Eosinophils Relative: 1.7 % (ref 0.0–5.0)
HCT: 39.1 % (ref 36.0–46.0)
Hemoglobin: 12.4 g/dL (ref 12.0–15.0)
Lymphocytes Relative: 23.1 % (ref 12.0–46.0)
Lymphs Abs: 1.2 10*3/uL (ref 0.7–4.0)
MCHC: 31.8 g/dL (ref 30.0–36.0)
MCV: 89.9 fl (ref 78.0–100.0)
Monocytes Absolute: 0.4 10*3/uL (ref 0.1–1.0)
Monocytes Relative: 7.6 % (ref 3.0–12.0)
Neutro Abs: 3.5 10*3/uL (ref 1.4–7.7)
Neutrophils Relative %: 66.8 % (ref 43.0–77.0)
Platelets: 258 10*3/uL (ref 150.0–400.0)
RBC: 4.34 Mil/uL (ref 3.87–5.11)
RDW: 14.5 % (ref 11.5–15.5)
WBC: 5.2 10*3/uL (ref 4.0–10.5)

## 2022-03-06 LAB — TSH: TSH: 1.01 u[IU]/mL (ref 0.35–5.50)

## 2022-03-06 LAB — LIPID PANEL
Cholesterol: 149 mg/dL (ref 0–200)
HDL: 29.8 mg/dL — ABNORMAL LOW (ref >39.00)
LDL Cholesterol: 99 mg/dL (ref 0–99)
NonHDL: 119.64
Total CHOL/HDL Ratio: 5
Triglycerides: 101 mg/dL (ref 0.0–149.0)
VLDL: 20.2 mg/dL (ref 0.0–40.0)

## 2022-03-06 LAB — VITAMIN D 25 HYDROXY (VIT D DEFICIENCY, FRACTURES): VITD: 11.3 ng/mL — ABNORMAL LOW (ref 30.00–100.00)

## 2022-03-06 LAB — HEMOGLOBIN A1C: Hgb A1c MFr Bld: 6 % (ref 4.6–6.5)

## 2022-03-06 MED ORDER — CHOLECALCIFEROL 1.25 MG (50000 UT) PO CAPS: 50000.0000 [IU] | ORAL_CAPSULE | ORAL | 1 refills | Status: DC

## 2022-03-06 MED ORDER — SERTRALINE HCL 100 MG PO TABS: 150.0000 mg | ORAL_TABLET | Freq: Every day | ORAL | 11 refills | Status: DC

## 2022-03-06 MED ORDER — FUROSEMIDE 20 MG PO TABS: 40.0000 mg | ORAL_TABLET | Freq: Every day | ORAL | 5 refills | Status: DC | PRN

## 2022-03-06 MED ORDER — AMLODIPINE BESYLATE 2.5 MG PO TABS: 2.5000 mg | ORAL_TABLET | Freq: Every day | ORAL | 3 refills | Status: DC

## 2022-03-06 MED ORDER — CETIRIZINE HCL 10 MG PO TABS: 10.0000 mg | ORAL_TABLET | Freq: Every day | ORAL | 3 refills | Status: DC

## 2022-03-06 MED ORDER — OMEPRAZOLE 40 MG PO CPDR: 40.0000 mg | DELAYED_RELEASE_CAPSULE | Freq: Every day | ORAL | 3 refills | Status: DC

## 2022-03-06 MED ORDER — PRAVASTATIN SODIUM 20 MG PO TABS: 20.0000 mg | ORAL_TABLET | Freq: Every day | ORAL | 3 refills | Status: DC

## 2022-03-06 MED ORDER — FLUTICASONE PROPIONATE 50 MCG/ACT NA SUSP: NASAL | 12 refills | Status: DC

## 2022-03-06 MED ORDER — OLOPATADINE HCL 0.2 % OP SOLN: 1.0000 [drp] | Freq: Every day | OPHTHALMIC | 11 refills | Status: DC

## 2022-03-06 MED ORDER — CYCLOBENZAPRINE HCL 10 MG PO TABS: ORAL_TABLET | ORAL | 5 refills | Status: DC

## 2022-03-06 MED ORDER — ZOLPIDEM TARTRATE ER 12.5 MG PO TBCR: 12.5000 mg | EXTENDED_RELEASE_TABLET | Freq: Every evening | ORAL | 5 refills | Status: DC | PRN

## 2022-03-06 MED ORDER — METHYLPREDNISOLONE ACETATE 40 MG/ML IJ SUSP
40.0000 mg | Freq: Once | INTRAMUSCULAR | Status: AC
  Administered 2022-03-06: 40 mg via INTRAMUSCULAR
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
## 2022-03-06 NOTE — Telephone Encounter (Signed)
Called pt to disc about lab results, unable to reach nor lvm. VM full  Per Dr.Tracy: Protein is low rec premier protein shakes otc 1-2 x per day  Cholesterol ok  Iron saturation low rec colonoscopy and upper endoscopy referred to GI today  Rec multivitamin with iron daily otc  Vitamin D low please take 1x per week D3 as prescribed   +prediabetes worse slightly  Blood cts and thyroid labs normal

## 2022-03-06 NOTE — Addendum Note (Signed)
Addended by: Orland Mustard on: 03/06/2022 04:46 PM   Modules accepted: Orders

## 2022-03-06 NOTE — Addendum Note (Signed)
Addended by: Everrett Coombe C on: 03/06/2022 11:32 AM   Modules accepted: Orders

## 2022-03-06 NOTE — Patient Instructions (Addendum)
  Dr. Posey Pronto  [phone] Not available [place] [id number]     [facility]  Dr. [facility]  colonoscopy  Phone Fax E-mail Address  [phone] Not available [place] [address]     Specialties     Gastroenterology              Zoster Vaccine, Recombinant injection x doses  Last dose before 6 months  What is this medication? ZOSTER VACCINE (ZOS ter vak SEEN) is a vaccine used to reduce the risk of getting shingles. This vaccine is not used to treat shingles or nerve pain from shingles. This medicine may be used for other purposes; ask your health care provider or pharmacist if you have questions. COMMON BRAND NAME(S): [facility] What should I tell my care team before I take this medication? They need to know if you have any of these conditions: cancer immune system problems an unusual or allergic reaction to Zoster vaccine, other medications, foods, dyes, or preservatives pregnant or trying to get pregnant breast-feeding How should I use this medication? This vaccine is injected into a muscle. It is given by a health care provider. A copy of Vaccine Information Statements will be given before each vaccination. Be sure to read this information carefully each time. This sheet may change often. Talk to your health care provider about the use of this vaccine in children. This vaccine is not approved for use in children. Overdosage: If you think you have taken too much of this medicine contact a poison control center or emergency room at once. NOTE: This medicine is only for you. Do not share this medicine with others. What if I miss a dose? Keep appointments for follow-up (booster) doses. It is important not to miss your dose. Call your health care provider if you are unable to keep an appointment. What may interact with this medication? medicines that suppress your immune system medicines to treat  cancer steroid medicines like prednisone or cortisone This list may not describe all possible interactions. Give your health care provider a list of all the medicines, herbs, non-prescription drugs, or dietary supplements you use. Also tell them if you smoke, drink alcohol, or use illegal drugs. Some items may interact with your medicine. What should I watch for while using this medication? Visit your health care provider regularly. This vaccine, like all vaccines, may not fully protect everyone. What side effects may I notice from receiving this medication? Side effects that you should report to your doctor or health care professional as soon as possible: allergic reactions (skin rash, itching or hives; swelling of the face, lips, or tongue) trouble breathing Side effects that usually do not require medical attention (report these to your doctor or health care professional if they continue or are bothersome): chills headache fever nausea pain, redness, or irritation at site where injected tiredness vomiting This list may not describe all possible side effects. Call your doctor for medical advice about side effects. You may report side effects to FDA at 1-800-FDA-1088. Where should I keep my medication? This vaccine is only given by a health care provider. It will not be stored at home. NOTE: This sheet is a summary. It may not cover all possible information. If you have questions about this medicine, talk to your doctor, pharmacist, or health care provider.  2023 Elsevier/Gold Standard (2021-07-27 00:00:00)

## 2022-03-07 ENCOUNTER — Ambulatory Visit: Payer: Commercial Managed Care - HMO

## 2022-03-07 ENCOUNTER — Other Ambulatory Visit: Payer: Self-pay | Admitting: Internal Medicine

## 2022-03-07 DIAGNOSIS — H1033 Unspecified acute conjunctivitis, bilateral: Secondary | ICD-10-CM

## 2022-03-07 NOTE — Telephone Encounter (Signed)
VM full I called patient to see if she needs this medication.  Malvin Morrish,cma

## 2022-04-01 ENCOUNTER — Other Ambulatory Visit: Payer: Self-pay | Admitting: Internal Medicine

## 2022-04-01 DIAGNOSIS — J329 Chronic sinusitis, unspecified: Secondary | ICD-10-CM

## 2022-04-01 DIAGNOSIS — J029 Acute pharyngitis, unspecified: Secondary | ICD-10-CM

## 2022-04-01 DIAGNOSIS — H9209 Otalgia, unspecified ear: Secondary | ICD-10-CM

## 2022-06-06 ENCOUNTER — Encounter: Payer: Self-pay | Admitting: Internal Medicine

## 2022-06-06 ENCOUNTER — Ambulatory Visit (INDEPENDENT_AMBULATORY_CARE_PROVIDER_SITE_OTHER): Payer: Commercial Managed Care - HMO | Admitting: Internal Medicine

## 2022-06-06 ENCOUNTER — Other Ambulatory Visit (HOSPITAL_COMMUNITY)
Admission: RE | Admit: 2022-06-06 | Discharge: 2022-06-06 | Disposition: A | Discharge status: Home / Self Care | Payer: Commercial Managed Care - HMO | Source: Ambulatory Visit | Attending: Internal Medicine | Admitting: Internal Medicine

## 2022-06-06 VITALS — BP 122/68 | HR 82 | Temp 98.1°F | Ht 64.0 in | Wt 227.6 lb

## 2022-06-06 DIAGNOSIS — R770 Abnormality of albumin: Secondary | ICD-10-CM | POA: Diagnosis not present

## 2022-06-06 DIAGNOSIS — J329 Chronic sinusitis, unspecified: Secondary | ICD-10-CM

## 2022-06-06 DIAGNOSIS — N760 Acute vaginitis: Secondary | ICD-10-CM | POA: Diagnosis present

## 2022-06-06 DIAGNOSIS — R600 Localized edema: Secondary | ICD-10-CM

## 2022-06-06 DIAGNOSIS — F5104 Psychophysiologic insomnia: Secondary | ICD-10-CM

## 2022-06-06 DIAGNOSIS — M5416 Radiculopathy, lumbar region: Secondary | ICD-10-CM

## 2022-06-06 DIAGNOSIS — I10 Essential (primary) hypertension: Secondary | ICD-10-CM | POA: Diagnosis not present

## 2022-06-06 DIAGNOSIS — R0982 Postnasal drip: Secondary | ICD-10-CM

## 2022-06-06 DIAGNOSIS — M62838 Other muscle spasm: Secondary | ICD-10-CM

## 2022-06-06 DIAGNOSIS — K219 Gastro-esophageal reflux disease without esophagitis: Secondary | ICD-10-CM

## 2022-06-06 DIAGNOSIS — F32A Depression, unspecified: Secondary | ICD-10-CM

## 2022-06-06 DIAGNOSIS — R300 Dysuria: Secondary | ICD-10-CM | POA: Insufficient documentation

## 2022-06-06 DIAGNOSIS — Z23 Encounter for immunization: Secondary | ICD-10-CM | POA: Diagnosis not present

## 2022-06-06 DIAGNOSIS — G4700 Insomnia, unspecified: Secondary | ICD-10-CM

## 2022-06-06 DIAGNOSIS — E559 Vitamin D deficiency, unspecified: Secondary | ICD-10-CM | POA: Diagnosis not present

## 2022-06-06 DIAGNOSIS — F419 Anxiety disorder, unspecified: Secondary | ICD-10-CM

## 2022-06-06 DIAGNOSIS — E785 Hyperlipidemia, unspecified: Secondary | ICD-10-CM

## 2022-06-06 LAB — COMPREHENSIVE METABOLIC PANEL
ALT: 7 U/L (ref 0–35)
AST: 10 U/L (ref 0–37)
Albumin: 4 g/dL (ref 3.5–5.2)
Alkaline Phosphatase: 58 U/L (ref 39–117)
BUN: 14 mg/dL (ref 6–23)
CO2: 29 mEq/L (ref 19–32)
Calcium: 9.2 mg/dL (ref 8.4–10.5)
Chloride: 105 mEq/L (ref 96–112)
Creatinine, Ser: 0.92 mg/dL (ref 0.40–1.20)
GFR: 71.3 mL/min (ref >60.00)
Glucose, Bld: 95 mg/dL (ref 70–99)
Potassium: 4.1 mEq/L (ref 3.5–5.1)
Sodium: 140 mEq/L (ref 135–145)
Total Bilirubin: 0.3 mg/dL (ref 0.2–1.2)
Total Protein: 6.4 g/dL (ref 6.0–8.3)

## 2022-06-06 LAB — VITAMIN D 25 HYDROXY (VIT D DEFICIENCY, FRACTURES): VITD: 20.91 ng/mL — ABNORMAL LOW (ref 30.00–100.00)

## 2022-06-06 MED ORDER — OMEPRAZOLE 40 MG PO CPDR: 40.0000 mg | DELAYED_RELEASE_CAPSULE | Freq: Every day | ORAL | 3 refills | Status: DC

## 2022-06-06 MED ORDER — CETIRIZINE HCL 10 MG PO TABS: 10.0000 mg | ORAL_TABLET | Freq: Every day | ORAL | 3 refills | Status: DC

## 2022-06-06 MED ORDER — PRAVASTATIN SODIUM 20 MG PO TABS: 20.0000 mg | ORAL_TABLET | Freq: Every day | ORAL | 3 refills | Status: DC

## 2022-06-06 MED ORDER — CYCLOBENZAPRINE HCL 10 MG PO TABS: ORAL_TABLET | ORAL | 5 refills | Status: DC

## 2022-06-06 MED ORDER — CHOLECALCIFEROL 1.25 MG (50000 UT) PO CAPS: 50000.0000 [IU] | ORAL_CAPSULE | ORAL | 1 refills | Status: DC

## 2022-06-06 MED ORDER — SERTRALINE HCL 100 MG PO TABS: 150.0000 mg | ORAL_TABLET | Freq: Every day | ORAL | 11 refills | Status: DC

## 2022-06-06 MED ORDER — AMLODIPINE BESYLATE 2.5 MG PO TABS: 2.5000 mg | ORAL_TABLET | Freq: Every day | ORAL | 3 refills | Status: DC

## 2022-06-06 MED ORDER — ZOLPIDEM TARTRATE ER 12.5 MG PO TBCR: 12.5000 mg | EXTENDED_RELEASE_TABLET | Freq: Every evening | ORAL | 5 refills | Status: DC | PRN
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
## 2022-06-06 NOTE — Patient Instructions (Addendum)
[email or web address] for free covid tests x 4   [facility]  Phone Fax E-mail Address  [phone] Not available [place] [address]     Specialties     Gastroenterology      Call to schedule colonoscopy and mammogram    Dr. Boneta Lucks -call for appt  Phone Fax E-mail Address  [phone] Not available [address] [address]     Specialties     Podiatry

## 2022-06-07 ENCOUNTER — Telehealth: Payer: Self-pay

## 2022-06-07 ENCOUNTER — Encounter: Payer: Self-pay | Admitting: Internal Medicine

## 2022-06-07 ENCOUNTER — Other Ambulatory Visit: Payer: Self-pay | Admitting: Internal Medicine

## 2022-06-07 DIAGNOSIS — A599 Trichomoniasis, unspecified: Secondary | ICD-10-CM

## 2022-06-07 LAB — URINE CYTOLOGY ANCILLARY ONLY
Bacterial Vaginitis-Urine: NEGATIVE
Candida Urine: NEGATIVE
Chlamydia: NEGATIVE
Comment: NEGATIVE
Comment: NEGATIVE
Comment: NORMAL
Neisseria Gonorrhea: NEGATIVE
Trichomonas: POSITIVE — AB

## 2022-06-07 LAB — URINE CULTURE
MICRO NUMBER:: 13981479
SPECIMEN QUALITY:: ADEQUATE

## 2022-06-07 LAB — URINALYSIS, ROUTINE W REFLEX MICROSCOPIC
Bacteria, UA: NONE SEEN /HPF
Bilirubin Urine: NEGATIVE
Glucose, UA: NEGATIVE
Hgb urine dipstick: NEGATIVE
Hyaline Cast: NONE SEEN /LPF
Ketones, ur: NEGATIVE
Nitrite: NEGATIVE
Protein, ur: NEGATIVE
Specific Gravity, Urine: 1.019 (ref 1.001–1.035)
WBC, UA: NONE SEEN /HPF (ref 0–5)
pH: 6.5 (ref 5.0–8.0)

## 2022-06-07 LAB — MICROSCOPIC MESSAGE

## 2022-06-07 MED ORDER — METRONIDAZOLE 500 MG PO TABS: 500.0000 mg | ORAL_TABLET | Freq: Two times a day (BID) | ORAL | 0 refills | Status: DC

## 2022-06-07 NOTE — Telephone Encounter (Signed)
LMOM for pt to CB in regards to labs 

## 2022-06-08 ENCOUNTER — Other Ambulatory Visit: Payer: Self-pay | Admitting: Podiatry

## 2022-06-11 ENCOUNTER — Ambulatory Visit: Payer: Commercial Managed Care - HMO

## 2022-06-18 ENCOUNTER — Ambulatory Visit: Payer: Commercial Managed Care - HMO

## 2022-08-20 ENCOUNTER — Ambulatory Visit: Payer: Commercial Managed Care - HMO | Admitting: Family Medicine

## 2022-08-20 ENCOUNTER — Encounter: Payer: Self-pay | Admitting: Family Medicine

## 2022-08-20 ENCOUNTER — Ambulatory Visit (INDEPENDENT_AMBULATORY_CARE_PROVIDER_SITE_OTHER): Payer: Commercial Managed Care - HMO | Admitting: Family Medicine

## 2022-08-20 VITALS — BP 142/80 | HR 102 | Temp 99.0°F | Resp 16 | Ht 64.0 in | Wt 219.4 lb

## 2022-08-20 DIAGNOSIS — J019 Acute sinusitis, unspecified: Secondary | ICD-10-CM

## 2022-08-20 MED ORDER — AMOXICILLIN-POT CLAVULANATE 875-125 MG PO TABS: 1.0000 | ORAL_TABLET | Freq: Two times a day (BID) | ORAL | 0 refills | Status: DC

## 2022-08-20 NOTE — Patient Instructions (Addendum)
Start augmentin.  Rest and fluids.  Gently try to pop your ears after taking a shower.   Take care.  Glad to see you.

## 2022-08-20 NOTE — Progress Notes (Unsigned)
duration of symptoms: 3 weeks.  It came/got some better/got worse again Episodically hoarse rhinorrhea: yes congestion: yes ear pain: yes sore throat: yes cough: yes, some yellow sputum myalgias: prev, but not now.  No fevers now but still having episodic chills.   Having facial pain.    Sick contacts at work.    Per HPI unless specifically indicated in ROS section  Meds, vitals, and allergies reviewed.   GEN: nad, alert and oriented HEENT: mucous membranes moist, TM w/o erythema, nasal epithelium injected, OP with cobblestoning NECK: supple w/o LA CV: rrr. PULM: ctab, no inc wob ABD: soft, +bs EXT: no edema Sinuses ttp x4.

## 2022-08-21 DIAGNOSIS — J019 Acute sinusitis, unspecified: Secondary | ICD-10-CM

## 2022-08-21 HISTORY — DX: Acute sinusitis, unspecified: J01.90

## 2022-08-21 NOTE — Assessment & Plan Note (Signed)
Start augmentin.  Rest and fluids.  Gently perform Valsalva. Supportive care otherwise.  Update [place] as needed.

## 2022-09-06 ENCOUNTER — Encounter: Payer: Self-pay | Admitting: Family Medicine

## 2022-11-29 ENCOUNTER — Other Ambulatory Visit (HOSPITAL_COMMUNITY): Payer: Self-pay

## 2022-12-05 ENCOUNTER — Encounter: Payer: Commercial Managed Care - HMO | Admitting: Family Medicine

## 2022-12-13 ENCOUNTER — Other Ambulatory Visit (HOSPITAL_COMMUNITY): Payer: Self-pay

## 2023-01-21 ENCOUNTER — Encounter: Payer: Medicaid Other | Admitting: Family Medicine

## 2023-02-27 ENCOUNTER — Telehealth: Payer: Self-pay | Admitting: Internal Medicine

## 2023-02-27 NOTE — Telephone Encounter (Signed)
Pt called in for the 3rd time to rescheduled her TOC appt with Clent Ridges. Her new appt now its on 04/29/23, but pt

## 2023-03-04 ENCOUNTER — Encounter: Payer: 59 | Admitting: Family Medicine

## 2023-04-03 ENCOUNTER — Other Ambulatory Visit: Payer: Self-pay

## 2023-04-03 DIAGNOSIS — G4700 Insomnia, unspecified: Secondary | ICD-10-CM

## 2023-04-03 DIAGNOSIS — F5104 Psychophysiologic insomnia: Secondary | ICD-10-CM

## 2023-04-03 NOTE — Telephone Encounter (Signed)
Patient needs appointment I have some availability this week if she able to come in for this until she can make it to her [facility]  Dana Allan, MD

## 2023-04-03 NOTE — Telephone Encounter (Signed)
Previously filled by Dr. French Ana   Last refilled: 06/06/2022 Last OV: 06/06/2022 with Dr. French Ana Next OV: 04/29/2023 establish care appt with Dr. Clent Ridges

## 2023-04-04 NOTE — Telephone Encounter (Signed)
I'm not sure why te note is marked as blocked.  I did not intentionally do this to hide anything sensitive from patient.  I recommend scheduling her an appointment with me and I have some availability this week if she can come in and I would be happy to discuss refill of scheduled medication prior to her TOC

## 2023-04-04 NOTE — Telephone Encounter (Signed)
Note is incomplete. Can you please complete and sign the encounter. It will not allow me to sign it.

## 2023-04-09 ENCOUNTER — Encounter (INDEPENDENT_AMBULATORY_CARE_PROVIDER_SITE_OTHER): Payer: Self-pay

## 2023-04-29 ENCOUNTER — Telehealth: Payer: Self-pay | Admitting: Family Medicine

## 2023-04-29 ENCOUNTER — Ambulatory Visit: Payer: BC Managed Care – PPO | Admitting: Family Medicine

## 2023-04-29 ENCOUNTER — Encounter: Payer: Self-pay | Admitting: Family Medicine

## 2023-04-29 VITALS — BP 132/76 | HR 70 | Temp 97.9°F | Resp 16 | Ht 64.0 in | Wt 202.1 lb

## 2023-04-29 DIAGNOSIS — I10 Essential (primary) hypertension: Secondary | ICD-10-CM

## 2023-04-29 DIAGNOSIS — K219 Gastro-esophageal reflux disease without esophagitis: Secondary | ICD-10-CM

## 2023-04-29 DIAGNOSIS — R7303 Prediabetes: Secondary | ICD-10-CM

## 2023-04-29 DIAGNOSIS — Z1231 Encounter for screening mammogram for malignant neoplasm of breast: Secondary | ICD-10-CM

## 2023-04-29 DIAGNOSIS — M5440 Lumbago with sciatica, unspecified side: Secondary | ICD-10-CM

## 2023-04-29 DIAGNOSIS — E559 Vitamin D deficiency, unspecified: Secondary | ICD-10-CM

## 2023-04-29 DIAGNOSIS — E785 Hyperlipidemia, unspecified: Secondary | ICD-10-CM

## 2023-04-29 DIAGNOSIS — E539 Vitamin B deficiency, unspecified: Secondary | ICD-10-CM

## 2023-04-29 DIAGNOSIS — R0982 Postnasal drip: Secondary | ICD-10-CM

## 2023-04-29 DIAGNOSIS — R600 Localized edema: Secondary | ICD-10-CM

## 2023-04-29 DIAGNOSIS — E669 Obesity, unspecified: Secondary | ICD-10-CM

## 2023-04-29 DIAGNOSIS — F39 Unspecified mood [affective] disorder: Secondary | ICD-10-CM

## 2023-04-29 DIAGNOSIS — J329 Chronic sinusitis, unspecified: Secondary | ICD-10-CM

## 2023-04-29 DIAGNOSIS — E611 Iron deficiency: Secondary | ICD-10-CM | POA: Diagnosis not present

## 2023-04-29 DIAGNOSIS — R7309 Other abnormal glucose: Secondary | ICD-10-CM | POA: Diagnosis not present

## 2023-04-29 DIAGNOSIS — F5104 Psychophysiologic insomnia: Secondary | ICD-10-CM

## 2023-04-29 DIAGNOSIS — M62838 Other muscle spasm: Secondary | ICD-10-CM

## 2023-04-29 DIAGNOSIS — M5416 Radiculopathy, lumbar region: Secondary | ICD-10-CM

## 2023-04-29 DIAGNOSIS — G8929 Other chronic pain: Secondary | ICD-10-CM

## 2023-04-29 DIAGNOSIS — Z1211 Encounter for screening for malignant neoplasm of colon: Secondary | ICD-10-CM

## 2023-04-29 LAB — LIPID PANEL
Cholesterol: 192 mg/dL (ref 0–200)
HDL: 30.6 mg/dL — ABNORMAL LOW (ref >39.00)
LDL Cholesterol: 122 mg/dL — ABNORMAL HIGH (ref 0–99)
NonHDL: 161.58
Total CHOL/HDL Ratio: 6
Triglycerides: 200 mg/dL — ABNORMAL HIGH (ref 0.0–149.0)
VLDL: 40 mg/dL (ref 0.0–40.0)

## 2023-04-29 LAB — COMPREHENSIVE METABOLIC PANEL
ALT: 8 U/L (ref 0–35)
AST: 11 U/L (ref 0–37)
Albumin: 4.1 g/dL (ref 3.5–5.2)
Alkaline Phosphatase: 79 U/L (ref 39–117)
BUN: 11 mg/dL (ref 6–23)
CO2: 31 mEq/L (ref 19–32)
Calcium: 9.8 mg/dL (ref 8.4–10.5)
Chloride: 104 mEq/L (ref 96–112)
Creatinine, Ser: 0.77 mg/dL (ref 0.40–1.20)
GFR: 87.73 mL/min (ref >60.00)
Glucose, Bld: 103 mg/dL — ABNORMAL HIGH (ref 70–99)
Potassium: 4.7 mEq/L (ref 3.5–5.1)
Sodium: 142 mEq/L (ref 135–145)
Total Bilirubin: 0.2 mg/dL (ref 0.2–1.2)
Total Protein: 6.7 g/dL (ref 6.0–8.3)

## 2023-04-29 LAB — IBC + FERRITIN
Ferritin: 34.2 ng/mL (ref 10.0–291.0)
Iron: 41 ug/dL — ABNORMAL LOW (ref 42–145)
Saturation Ratios: 11.4 % — ABNORMAL LOW (ref 20.0–50.0)
TIBC: 359.8 ug/dL (ref 250.0–450.0)
Transferrin: 257 mg/dL (ref 212.0–360.0)

## 2023-04-29 LAB — CBC WITH DIFFERENTIAL/PLATELET
Basophils Absolute: 0 10*3/uL (ref 0.0–0.1)
Basophils Relative: 0.7 % (ref 0.0–3.0)
Eosinophils Absolute: 0.2 10*3/uL (ref 0.0–0.7)
Eosinophils Relative: 2.8 % (ref 0.0–5.0)
HCT: 38.4 % (ref 36.0–46.0)
Hemoglobin: 12.2 g/dL (ref 12.0–15.0)
Lymphocytes Relative: 34.7 % (ref 12.0–46.0)
Lymphs Abs: 1.9 10*3/uL (ref 0.7–4.0)
MCHC: 31.9 g/dL (ref 30.0–36.0)
MCV: 91.8 fl (ref 78.0–100.0)
Monocytes Absolute: 0.4 10*3/uL (ref 0.1–1.0)
Monocytes Relative: 7.5 % (ref 3.0–12.0)
Neutro Abs: 3 10*3/uL (ref 1.4–7.7)
Neutrophils Relative %: 54.3 % (ref 43.0–77.0)
Platelets: 254 10*3/uL (ref 150.0–400.0)
RBC: 4.18 Mil/uL (ref 3.87–5.11)
RDW: 14.4 % (ref 11.5–15.5)
WBC: 5.4 10*3/uL (ref 4.0–10.5)

## 2023-04-29 LAB — TSH: TSH: 1.48 u[IU]/mL (ref 0.35–5.50)

## 2023-04-29 LAB — VITAMIN B12: Vitamin B-12: 439 pg/mL (ref 211–911)

## 2023-04-29 LAB — HEMOGLOBIN A1C: Hgb A1c MFr Bld: 5.7 % (ref 4.6–6.5)

## 2023-04-29 LAB — VITAMIN D 25 HYDROXY (VIT D DEFICIENCY, FRACTURES): VITD: 19.72 ng/mL — ABNORMAL LOW (ref 30.00–100.00)

## 2023-04-29 MED ORDER — PRAVASTATIN SODIUM 20 MG PO TABS
20.0000 mg | ORAL_TABLET | Freq: Every day | ORAL | 3 refills | Status: DC
Start: 2023-04-29 — End: 2023-09-22

## 2023-04-29 MED ORDER — SERTRALINE HCL 100 MG PO TABS: 150.0000 mg | ORAL_TABLET | Freq: Every day | ORAL | 11 refills | Status: DC

## 2023-04-29 MED ORDER — OMEPRAZOLE 40 MG PO CPDR: 40.0000 mg | DELAYED_RELEASE_CAPSULE | Freq: Every day | ORAL | 3 refills | Status: DC

## 2023-04-29 MED ORDER — FLUTICASONE PROPIONATE 50 MCG/ACT NA SUSP
NASAL | 12 refills | Status: DC
Start: 2023-04-29 — End: 2023-11-19

## 2023-04-29 MED ORDER — CYCLOBENZAPRINE HCL 5 MG PO TABS
5.0000 mg | ORAL_TABLET | Freq: Every evening | ORAL | 1 refills | Status: DC | PRN
Start: 2023-04-29 — End: 2023-09-22

## 2023-04-29 MED ORDER — CETIRIZINE HCL 10 MG PO TABS
10.0000 mg | ORAL_TABLET | Freq: Every day | ORAL | 3 refills | Status: DC
Start: 2023-04-29 — End: 2023-11-19

## 2023-04-29 MED ORDER — AMLODIPINE BESYLATE 2.5 MG PO TABS
2.5000 mg | ORAL_TABLET | Freq: Every day | ORAL | 3 refills | Status: DC
Start: 2023-04-29 — End: 2024-05-24

## 2023-04-29 NOTE — Progress Notes (Signed)
SUBJECTIVE:   Chief Complaint  Patient presents with   Establish Care    Transfer from Dr. Judie Grieve   HPI Presents to clinic to transfer care  Needs refills on medications  HTN Asymptomatic.  Takes Amlodipine 2.5 mg daily.  Denies any headaches, visual changes, chest pain, heart palpitations, shortness of breath or lower extremity edema. Requesting refill.  Mood Disorder Currently treated with SSRI.  Takes Zoloft 150 mg daily. Continues to have symptoms of depressed mood. Does not want to increase medication at this time. Has not been evaluated by psychiatry or seen therapist.  Was previously prescribed Ambien CR 12.5 mg for anxiety and sleep.  Last filled 11/2022.  Has been without medication for a few months.  Discussed risks of long term use of Ambien, including confusion, falls, and decline in cognition.  Patient agreeable to holding off ion restarting Ambien and starting trial of SSRI  Requesting refill for Flonase nasal spray  Requesting refill of Flexeril for low back pain and muscle spasm.    HLD Prescribed Pravastatin.  Reports taking medication for 3 weeks but stopped secondary to GI upset.  Not interested in restarting medication   PERTINENT PMH / PSH: HTN Mood disorder HLD   OBJECTIVE:  BP 132/76   Pulse 70   Temp 97.9 F (36.6 C)   Resp 16   Ht 5\' 4"  (1.626 m)   Wt 202 lb 2 oz (91.7 kg)   LMP 03/08/2018 (Exact Date)   SpO2 98%   BMI 34.69 kg/m    Physical Exam Vitals reviewed.  Constitutional:      General: She is not in acute distress.    Appearance: She is obese. She is not ill-appearing.  HENT:     Head: Normocephalic.     Right Ear: Tympanic membrane, ear canal and external ear normal.     Left Ear: Tympanic membrane, ear canal and external ear normal.     Nose: Nose normal.     Mouth/Throat:     Mouth: Mucous membranes are moist.  Eyes:     Extraocular Movements: Extraocular movements intact.     Conjunctiva/sclera: Conjunctivae  normal.     Pupils: Pupils are equal, round, and reactive to light.  Neck:     Thyroid: No thyromegaly or thyroid tenderness.     Vascular: No carotid bruit.  Cardiovascular:     Rate and Rhythm: Normal rate and regular rhythm.     Pulses: Normal pulses.     Heart sounds: Normal heart sounds.  Pulmonary:     Effort: Pulmonary effort is normal.     Breath sounds: Normal breath sounds.  Abdominal:     General: Bowel sounds are normal. There is no distension.     Palpations: Abdomen is soft.     Tenderness: There is no abdominal tenderness. There is no right CVA tenderness, left CVA tenderness, guarding or rebound.  Musculoskeletal:        General: Normal range of motion.     Cervical back: Normal range of motion.     Right lower leg: No edema.     Left lower leg: No edema.  Lymphadenopathy:     Cervical: No cervical adenopathy.  Skin:    Capillary Refill: Capillary refill takes less than 2 seconds.  Neurological:     General: No focal deficit present.     Mental Status: She is alert and oriented to person, place, and time. Mental status is at baseline.  Motor: No weakness.  Psychiatric:        Mood and Affect: Mood normal.        Behavior: Behavior normal.        Thought Content: Thought content normal.        Judgment: Judgment normal.        04/29/2023    9:04 AM 06/06/2022    8:41 AM 03/06/2022   10:20 AM 08/28/2021    2:33 PM 10/27/2020    3:10 PM  Depression screen PHQ 2/9  Decreased Interest 2 1 2 2  0  Down, Depressed, Hopeless 1 1 2 1  0  PHQ - 2 Score 3 2 4 3  0  Altered sleeping 3 3 3 3  0  Tired, decreased energy 3 3 3 3  0  Change in appetite 0 2 0 1 0  Feeling bad or failure about yourself  0 1 1 0 0  Trouble concentrating 0 0 1 0 0  Moving slowly or fidgety/restless 0 0 0 0 0  Suicidal thoughts 0 0 0 0 0  PHQ-9 Score 9 11 12 10  0  Difficult doing work/chores Very difficult Somewhat difficult Very difficult Somewhat difficult Not difficult at all       04/29/2023    9:04 AM 03/06/2022   11:30 AM 08/28/2021    2:33 PM 11/30/2019    8:27 AM  GAD 7 : Generalized Anxiety Score  Nervous, Anxious, on Edge 1 0 1 0  Control/stop worrying 1 2 1  0  Worry too much - different things 1 3 1  0  Trouble relaxing 1 3 2  0  Restless 0 0 0 0  Easily annoyed or irritable 0 1 1 0  Afraid - awful might happen 0 0 0 0  Total GAD 7 Score 4 9 6  0  Anxiety Difficulty Somewhat difficult Very difficult Somewhat difficult Not difficult at all    ASSESSMENT/PLAN:  Vitamin D deficiency Assessment & Plan: Check Vitamin D level  Orders: -     VITAMIN D 25 Hydroxy (Vit-D Deficiency, Fractures)  Iron deficiency -     CBC with Differential/Platelet -     IBC + Ferritin  Vitamin B deficiency -     Vitamin B12  Essential hypertension -     Comprehensive metabolic panel -     amLODIPine Besylate; Take 1 tablet (2.5 mg total) by mouth daily.  Dispense: 90 tablet; Refill: 3  PND (post-nasal drip) -     Cetirizine HCl; Take 1 tablet (10 mg total) by mouth at bedtime. Prn  Dispense: 90 tablet; Refill: 3 -     Fluticasone Propionate; SHAKE LIQUID AND USE 2 SPRAYS IN EACH NOSTRIL DAILY  Dispense: 16 g; Refill: 12  Muscle spasm -     Cyclobenzaprine HCl; Take 1 tablet (5 mg total) by mouth at bedtime as needed for muscle spasms.  Dispense: 30 tablet; Refill: 1  Gastroesophageal reflux disease -     Omeprazole; Take 1 capsule (40 mg total) by mouth daily. On empty stomach 30 min before food  Dispense: 90 capsule; Refill: 3  Hyperlipidemia, unspecified hyperlipidemia type Assessment & Plan: Chronic Self discontinued Pravachol Check fasting lipids  Orders: -     Lipid panel -     Pravastatin Sodium; Take 1 tablet (20 mg total) by mouth daily. After 6 pm  Dispense: 90 tablet; Refill: 3  Colon cancer screening -     Ambulatory referral to Gastroenterology  Encounter for screening mammogram for malignant neoplasm of  breast -     3D Screening Mammogram, Left  and Right; Future  Abnormal glucose Assessment & Plan: Check A1c  Orders: -     Hemoglobin A1c  Mood disorder (HCC) Assessment & Plan: Chronic Continue Zoloft 150 mg daily Consider increase in medication Had not taken Ambien since March. Discussed sleep hygiene  Recommend consider Trazodone and if wanting to try can order at next visit Mental health resources provided Follow up in 1 week  Orders: -     Sertraline HCl; Take 1.5 tablets (150 mg total) by mouth daily.  Dispense: 45 tablet; Refill: 11 -     TSH  Obesity (BMI 30.0-34.9) -     TSH  Chronic low back pain with sciatica, sciatica laterality unspecified, unspecified back pain laterality Assessment & Plan: Refill Flexeril 5 mg as needed   Chronic insomnia Assessment & Plan: Has taken Ambien since March Continues to have difficulty sleeping Discussed current dose of Ambien exceeds limit Recommend trial Trazodone given concurrent depression Patient to consider and will discuss at next visit    PDMP reviewed  Return in about 1 week (around 05/06/2023) for PCP.  Dana Allan, MD

## 2023-04-29 NOTE — Telephone Encounter (Signed)
Prescription Request  04/29/2023  LOV: 04/29/2023  What is the name of the medication or equipment? zolpidem (AMBIEN CR) 12.5 MG CR tablet and Trazodone  Have you contacted your pharmacy to request a refill? Yes   Which pharmacy would you like this sent to?   [id number] [place], [place] - [address] AT [facility] [address] [place] [address] Phone: [phone] Fax: [phone]    Patient notified that their request is being sent to the clinical staff for review and that they should receive a response within 2 business days.   Please advise at Mobile [phone] (mobile)

## 2023-04-29 NOTE — Patient Instructions (Addendum)
It was a pleasure meeting you today. Thank you for allowing me to take part in your health care.  Our goals for today as we discussed include:  Refills sent for requested medications  Review education on medications provided  [facility]  [address]  [place] [address] [phone]    [email or web address]  [email or web address]  We will get some labs today.  If they are abnormal or we need to do something about them, I will call you.  If they are normal, I will send you a message on MyChart (if it is active) or a letter in the mail.  If you don't hear from [place] in 2 weeks, please call the office at the number below.   Follow up in 1-2 weeks   If you have any questions or concerns, please do not hesitate to call the office at [phone].  I look forward to our next visit and until then take care and stay safe.  Regards,   Dana Allan, MD   [facility]

## 2023-04-30 NOTE — Telephone Encounter (Signed)
Noted  

## 2023-05-02 ENCOUNTER — Encounter: Payer: Self-pay | Admitting: *Deleted

## 2023-05-02 ENCOUNTER — Other Ambulatory Visit (HOSPITAL_COMMUNITY): Payer: Self-pay

## 2023-05-02 ENCOUNTER — Telehealth: Payer: Self-pay | Admitting: Pharmacy Technician

## 2023-05-02 NOTE — Telephone Encounter (Signed)
Pharmacy Patient Advocate Encounter   Received notification from CoverMyMeds that prior authorization for Omeprazole 40MG  dr capsules is required/requested.   Insurance verification completed.   The patient is insured through  [employer]  .   Per test claim: The current 05/02/23 day co-pay is, $7.91.  No PA needed at this time. This test claim was processed through [facility]- copay amounts may vary at other pharmacies due to pharmacy/plan contracts, or as the patient moves through the different stages of their insurance plan.

## 2023-05-02 NOTE — Telephone Encounter (Signed)
Pharmacy Patient Advocate Encounter   Received notification from CoverMyMeds that prior authorization for Fluticasone 62mcg/act nasal spray is required/requested.   Insurance verification completed.   The patient is insured through  [employer]  .   Per test claim: The current 05/02/23 day co-pay is, $10.09.  No PA needed at this time. This test claim was processed through [facility]- copay amounts may vary at other pharmacies due to pharmacy/plan contracts, or as the patient moves through the different stages of their insurance plan.

## 2023-05-09 ENCOUNTER — Encounter: Payer: Self-pay | Admitting: *Deleted

## 2023-05-13 ENCOUNTER — Other Ambulatory Visit: Payer: Self-pay | Admitting: Family Medicine

## 2023-05-13 ENCOUNTER — Ambulatory Visit: Payer: BC Managed Care – PPO | Admitting: Family Medicine

## 2023-05-13 ENCOUNTER — Encounter: Payer: Self-pay | Admitting: Family Medicine

## 2023-05-13 DIAGNOSIS — E611 Iron deficiency: Secondary | ICD-10-CM

## 2023-05-13 DIAGNOSIS — E559 Vitamin D deficiency, unspecified: Secondary | ICD-10-CM

## 2023-05-13 MED ORDER — IRON (FERROUS SULFATE) 325 (65 FE) MG PO TABS
325.0000 mg | ORAL_TABLET | ORAL | 3 refills | Status: DC
Start: 2023-05-13 — End: 2024-05-24

## 2023-05-13 MED ORDER — VITAMIN D (ERGOCALCIFEROL) 1.25 MG (50000 UNIT) PO CAPS: 50000.0000 [IU] | ORAL_CAPSULE | ORAL | 1 refills | Status: DC

## 2023-05-15 ENCOUNTER — Ambulatory Visit: Payer: BC Managed Care – PPO | Admitting: Family Medicine

## 2023-05-16 ENCOUNTER — Encounter: Payer: Self-pay | Admitting: Family Medicine

## 2023-05-16 DIAGNOSIS — M62838 Other muscle spasm: Secondary | ICD-10-CM

## 2023-05-16 DIAGNOSIS — R7309 Other abnormal glucose: Secondary | ICD-10-CM | POA: Insufficient documentation

## 2023-05-16 DIAGNOSIS — R0982 Postnasal drip: Secondary | ICD-10-CM | POA: Insufficient documentation

## 2023-05-16 DIAGNOSIS — F39 Unspecified mood [affective] disorder: Secondary | ICD-10-CM | POA: Insufficient documentation

## 2023-05-16 DIAGNOSIS — Z1211 Encounter for screening for malignant neoplasm of colon: Secondary | ICD-10-CM | POA: Insufficient documentation

## 2023-05-16 DIAGNOSIS — E539 Vitamin B deficiency, unspecified: Secondary | ICD-10-CM | POA: Insufficient documentation

## 2023-05-16 DIAGNOSIS — Z1231 Encounter for screening mammogram for malignant neoplasm of breast: Secondary | ICD-10-CM | POA: Insufficient documentation

## 2023-05-16 DIAGNOSIS — E669 Obesity, unspecified: Secondary | ICD-10-CM | POA: Insufficient documentation

## 2023-05-16 HISTORY — DX: Other muscle spasm: M62.838

## 2023-05-16 NOTE — Assessment & Plan Note (Signed)
Check Vitamin D level 

## 2023-05-16 NOTE — Assessment & Plan Note (Signed)
>>  ASSESSMENT AND PLAN FOR CHRONIC LOW BACK PAIN WITH SCIATICA WRITTEN ON 05/16/2023  2:25 PM BY WALSH, TANYA, MD  Refill Flexeril  5 mg as needed

## 2023-05-16 NOTE — Assessment & Plan Note (Signed)
Chronic Self discontinued Pravachol Check fasting lipids

## 2023-05-16 NOTE — Assessment & Plan Note (Signed)
Check A1c. 

## 2023-05-16 NOTE — Assessment & Plan Note (Signed)
Has taken Ambien since March Continues to have difficulty sleeping Discussed current dose of Ambien exceeds limit Recommend trial Trazodone given concurrent depression Patient to consider and will discuss at next visit

## 2023-05-16 NOTE — Assessment & Plan Note (Addendum)
Chronic Continue Zoloft 150 mg daily Consider increase in medication Had not taken Ambien since March. Discussed sleep hygiene  Recommend consider Trazodone and if wanting to try can order at next visit Mental health resources provided Follow up in 1 week

## 2023-05-16 NOTE — Assessment & Plan Note (Signed)
Refill Flexeril 5 mg as needed

## 2023-07-11 ENCOUNTER — Telehealth: Payer: Self-pay

## 2023-07-11 NOTE — Telephone Encounter (Signed)
Printed out and handed Dr. Clent Ridges triage notes.

## 2023-07-11 NOTE — Telephone Encounter (Signed)
Patient states she has been experiencing headaches, dizziness, and she has been tongue-tied. Patient states she has had these symptoms off and on for about a month. I transferred call to Access Nurse.

## 2023-07-11 NOTE — Telephone Encounter (Signed)
  Printed out and handed to Dr. Clent Ridges. Pt advised to go to ER.

## 2023-09-22 ENCOUNTER — Ambulatory Visit (INDEPENDENT_AMBULATORY_CARE_PROVIDER_SITE_OTHER): Payer: BC Managed Care – PPO | Admitting: Family Medicine

## 2023-09-22 ENCOUNTER — Encounter: Payer: Self-pay | Admitting: Family Medicine

## 2023-09-22 VITALS — BP 144/74 | HR 70 | Temp 98.2°F | Resp 18 | Ht 64.0 in | Wt 206.2 lb

## 2023-09-22 DIAGNOSIS — E782 Mixed hyperlipidemia: Secondary | ICD-10-CM

## 2023-09-22 DIAGNOSIS — M62838 Other muscle spasm: Secondary | ICD-10-CM | POA: Diagnosis not present

## 2023-09-22 DIAGNOSIS — M5416 Radiculopathy, lumbar region: Secondary | ICD-10-CM | POA: Diagnosis not present

## 2023-09-22 DIAGNOSIS — M545 Low back pain, unspecified: Secondary | ICD-10-CM | POA: Diagnosis not present

## 2023-09-22 DIAGNOSIS — F39 Unspecified mood [affective] disorder: Secondary | ICD-10-CM

## 2023-09-22 DIAGNOSIS — F5104 Psychophysiologic insomnia: Secondary | ICD-10-CM

## 2023-09-22 DIAGNOSIS — G4700 Insomnia, unspecified: Secondary | ICD-10-CM

## 2023-09-22 DIAGNOSIS — E66811 Obesity, class 1: Secondary | ICD-10-CM

## 2023-09-22 DIAGNOSIS — I10 Essential (primary) hypertension: Secondary | ICD-10-CM

## 2023-09-22 MED ORDER — KETOROLAC TROMETHAMINE 60 MG/2ML IM SOLN
60.0000 mg | Freq: Once | INTRAMUSCULAR | Status: AC
Start: 2023-09-22 — End: 2023-09-22
  Administered 2023-09-22: 30 mg via INTRAMUSCULAR

## 2023-09-22 MED ORDER — ROSUVASTATIN CALCIUM 10 MG PO TABS
10.0000 mg | ORAL_TABLET | Freq: Every evening | ORAL | 3 refills | Status: DC
Start: 2023-09-22 — End: 2024-05-21

## 2023-09-22 MED ORDER — CYCLOBENZAPRINE HCL 10 MG PO TABS
10.0000 mg | ORAL_TABLET | Freq: Every day | ORAL | 1 refills | Status: DC
Start: 2023-09-22 — End: 2023-11-19

## 2023-09-22 MED ORDER — NAPROXEN SODIUM 550 MG PO TABS
550.0000 mg | ORAL_TABLET | Freq: Two times a day (BID) | ORAL | 0 refills | Status: AC
Start: 2023-09-22 — End: 2023-10-06

## 2023-09-22 NOTE — Progress Notes (Signed)
 SUBJECTIVE:   Chief Complaint  Patient presents with   Back Pain    X 3 day all over the back hard to learn forward.   HPI Presents for acute visit  Discussed the use of AI scribe software for clinical note transcription with the patient, who gave verbal consent to proceed.  History of Present Illness The patient, with a history of hypertension, hyperlipidemia, and chronic back pain, presents with a three-day history of worsening back pain. The pain, described as a 'dull, sharp' sensation, originates in the mid-back and radiates down to the hip. The patient denies any precipitating injury or event, stating the pain 'came all of a sudden' during sleep. The pain is similar to previous episodes but is reportedly more severe this time. The patient denies any associated fever, urinary symptoms, or lower extremity numbness or tingling.  The patient has been managing the pain with over-the-counter Tylenol  and Advil , and a heating pad, but reports no relief. The patient has a history of taking Flexeril  for back pain, which sometimes helps, and is usually taken at night. The patient also reports difficulty sleeping despite taking Zoloft  for calming effects. The patient denies any recent weight changes.    PERTINENT PMH / PSH: As above  OBJECTIVE:  BP (!) 144/74   Pulse 70   Temp 98.2 F (36.8 C)   Resp 18   Ht 5' 4 (1.626 m)   Wt 206 lb 4 oz (93.6 kg)   LMP 03/08/2018 (Exact Date)   SpO2 99%   BMI 35.40 kg/m    Physical Exam Vitals reviewed.  Constitutional:      General: She is not in acute distress.    Appearance: She is obese. She is not ill-appearing.  HENT:     Head: Normocephalic.     Nose: Nose normal.  Eyes:     Conjunctiva/sclera: Conjunctivae normal.  Cardiovascular:     Rate and Rhythm: Normal rate.  Pulmonary:     Effort: Pulmonary effort is normal.  Musculoskeletal:     Cervical back: Normal range of motion.     Thoracic back: Spasms and tenderness present.      Lumbar back: Spasms and tenderness present. No bony tenderness. Negative right straight leg raise test and negative left straight leg raise test.     Right hip: No deformity. Normal range of motion.     Left hip: No deformity. Normal range of motion.  Neurological:     Mental Status: She is alert and oriented to person, place, and time. Mental status is at baseline.     Sensory: No sensory deficit.     Motor: No weakness.     Coordination: Coordination normal.     Gait: Gait normal.  Psychiatric:        Mood and Affect: Mood normal.        Behavior: Behavior normal.        Thought Content: Thought content normal.        Judgment: Judgment normal.        09/22/2023    9:01 AM 04/29/2023    9:04 AM 06/06/2022    8:41 AM 03/06/2022   10:20 AM 08/28/2021    2:33 PM  Depression screen PHQ 2/9  Decreased Interest 1 2 1 2 2   Down, Depressed, Hopeless 1 1 1 2 1   PHQ - 2 Score 2 3 2 4 3   Altered sleeping 3 3 3 3 3   Tired, decreased energy 3 3 3  3  3  Change in appetite 0 0 2 0 1  Feeling bad or failure about yourself  0 0 1 1 0  Trouble concentrating 0 0 0 1 0  Moving slowly or fidgety/restless 0 0 0 0 0  Suicidal thoughts 0 0 0 0 0  PHQ-9 Score 8 9 11 12 10   Difficult doing work/chores Somewhat difficult Very difficult Somewhat difficult Very difficult Somewhat difficult      09/22/2023    9:01 AM 04/29/2023    9:04 AM 03/06/2022   11:30 AM 08/28/2021    2:33 PM  GAD 7 : Generalized Anxiety Score  Nervous, Anxious, on Edge 0 1 0 1  Control/stop worrying 0 1 2 1   Worry too much - different things 0 1 3 1   Trouble relaxing 1 1 3 2   Restless 0 0 0 0  Easily annoyed or irritable 0 0 1 1  Afraid - awful might happen 0 0 0 0  Total GAD 7 Score 1 4 9 6   Anxiety Difficulty Not difficult at all Somewhat difficult Very difficult Somewhat difficult    ASSESSMENT/PLAN:  Acute bilateral low back pain without sciatica Assessment & Plan: Acute exacerbation of chronic lower back pain,  worse than previous episodes. Pain radiates to the hip but not down the legs. No associated fever, urinary symptoms, or neurologic symptoms. Pain exacerbated by movement and not relieved by over-the-counter analgesics or heat application. -Order lumbar spine X-ray. -Administer Toradol  30 mg injection for immediate pain relief. -Prescribe Naproxen  for ongoing pain management, to be started 8 hours after Toradol  injection. -Refill Flexeril  prescription, to be taken at night. -Schedule follow-up appointment in 2 weeks to reassess pain.  Orders: -     Ketorolac  Tromethamine  -     Cyclobenzaprine  HCl; Take 1 tablet (10 mg total) by mouth at bedtime.  Dispense: 30 tablet; Refill: 1 -     Naproxen  Sodium; Take 1 tablet (550 mg total) by mouth 2 (two) times daily with a meal for 14 days.  Dispense: 28 tablet; Refill: 0  Muscle spasm -     Cyclobenzaprine  HCl; Take 1 tablet (10 mg total) by mouth at bedtime.  Dispense: 30 tablet; Refill: 1  Lumbar radiculopathy -     Ketorolac  Tromethamine  -     Cyclobenzaprine  HCl; Take 1 tablet (10 mg total) by mouth at bedtime.  Dispense: 30 tablet; Refill: 1 -     Naproxen  Sodium; Take 1 tablet (550 mg total) by mouth 2 (two) times daily with a meal for 14 days.  Dispense: 28 tablet; Refill: 0  Mood disorder (HCC) Assessment & Plan: -Managed with Zoloft  150 mg daily   Obesity (BMI 30.0-34.9)  Mixed hyperlipidemia Assessment & Plan: Patient reports intolerance to Pravastatin  due to nausea and vomiting. -Discontinue Pravastatin . -Start Crestor  10mg  for cholesterol management.  Orders: -     Rosuvastatin  Calcium ; Take 1 tablet (10 mg total) by mouth every evening.  Dispense: 90 tablet; Refill: 3  Essential hypertension Assessment & Plan: Elevated blood pressure at today's visit, patient did not take antihypertensive medication today and currently having low back pain which is likely contributing. -Continue Amlodipine  10 mg daily.  Discussed importance  of medication compliance. -Monitor BP at home, goal <150/90   Chronic insomnia Assessment & Plan: Patient reports difficulty sleeping, previously discussed starting Ambien . -Recent increase of Flexeril  -Follow up in 2 weeks, if no improvement can restart at Ambien  CR 6.25 mg. -Consider sleep studies     PDMP reviewed  Return in about 2 weeks (around 10/06/2023) for PCP, back pain.  Glenys Ferrari, MD

## 2023-09-22 NOTE — Patient Instructions (Addendum)
 It was a pleasure meeting you today. Thank you for allowing me to take part in your health care.  Our goals for today as we discussed include:  Toradol  30 mg injection given today.  Do not take Naproxen , Ibuprofen  or any NSAID products for 8 hours after injection  Start Naproxen  550 mg two times a day for 14 days. Start Flexeril  10 mg at night  Can refer to physical therapy if no improvement Continue gentle low back stretching  Stop Pravastatin  Start Rosuvastatin  10 mg every evening  Blood pressure is higher today.  Important to take blood pressure medication as prescribed. If remains elevated at next visit plan to increase medication.  This is a list of the screening recommended for you and due dates:  Health Maintenance  Topic Date Due   Colon Cancer Screening  Never done   Mammogram  Never done   Zoster (Shingles) Vaccine (1 of 2) Never done   Flu Shot  12/08/2023*   DTaP/Tdap/Td vaccine (6 - Td or Tdap) 12/12/2027   Hepatitis C Screening  Completed   HIV Screening  Completed   HPV Vaccine  Aged Out   COVID-19 Vaccine  Discontinued  *Topic was postponed. The date shown is not the original due date.     Follow up in 2 weeks  If you have any questions or concerns, please do not hesitate to call the office at [phone].  I look forward to our next visit and until then take care and stay safe.  Regards,   Glenys Ferrari, MD   [facility]

## 2023-09-23 ENCOUNTER — Encounter: Payer: Self-pay | Admitting: Family Medicine

## 2023-09-23 DIAGNOSIS — M545 Low back pain, unspecified: Secondary | ICD-10-CM

## 2023-09-23 HISTORY — DX: Low back pain, unspecified: M54.50

## 2023-09-23 NOTE — Assessment & Plan Note (Signed)
 Acute exacerbation of chronic lower back pain, worse than previous episodes. Pain radiates to the hip but not down the legs. No associated fever, urinary symptoms, or neurologic symptoms. Pain exacerbated by movement and not relieved by over-the-counter analgesics or heat application. -Order lumbar spine X-ray. -Administer Toradol  30 mg injection for immediate pain relief. -Prescribe Naproxen  for ongoing pain management, to be started 8 hours after Toradol  injection. -Refill Flexeril  prescription, to be taken at night. -Schedule follow-up appointment in 2 weeks to reassess pain.

## 2023-09-23 NOTE — Assessment & Plan Note (Signed)
 Elevated blood pressure at today's visit, patient did not take antihypertensive medication today and currently having low back pain which is likely contributing. -Continue Amlodipine  10 mg daily.  Discussed importance of medication compliance. -Monitor BP at home, goal <150/90

## 2023-09-23 NOTE — Assessment & Plan Note (Signed)
 Patient reports difficulty sleeping, previously discussed starting Ambien. -Recent increase of Flexeril -Follow up in 2 weeks, if no improvement can restart at Ambien CR 6.25 mg. -Consider sleep studies

## 2023-09-23 NOTE — Assessment & Plan Note (Signed)
 Patient reports intolerance to Pravastatin due to nausea and vomiting. -Discontinue Pravastatin. -Start Crestor 10mg  for cholesterol management.

## 2023-09-23 NOTE — Assessment & Plan Note (Signed)
-  Managed with Zoloft 150 mg daily

## 2023-10-06 ENCOUNTER — Ambulatory Visit: Payer: BC Managed Care – PPO | Admitting: Family Medicine

## 2023-10-11 ENCOUNTER — Telehealth: Payer: BC Managed Care – PPO | Admitting: Family Medicine

## 2023-10-11 DIAGNOSIS — J111 Influenza due to unidentified influenza virus with other respiratory manifestations: Secondary | ICD-10-CM | POA: Diagnosis not present

## 2023-10-11 MED ORDER — OSELTAMIVIR PHOSPHATE 75 MG PO CAPS: 75.0000 mg | ORAL_CAPSULE | Freq: Two times a day (BID) | ORAL | 0 refills | Status: AC

## 2023-10-11 MED ORDER — PROMETHAZINE-DM 6.25-15 MG/5ML PO SYRP: 5.0000 mL | ORAL_SOLUTION | Freq: Four times a day (QID) | ORAL | 0 refills | Status: AC | PRN

## 2023-10-11 NOTE — Patient Instructions (Signed)
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
## 2023-10-22 ENCOUNTER — Ambulatory Visit: Payer: BC Managed Care – PPO | Admitting: Family Medicine

## 2023-11-18 ENCOUNTER — Ambulatory Visit: Payer: BC Managed Care – PPO | Admitting: Family Medicine

## 2023-11-19 ENCOUNTER — Ambulatory Visit (INDEPENDENT_AMBULATORY_CARE_PROVIDER_SITE_OTHER): Admitting: Family Medicine

## 2023-11-19 ENCOUNTER — Encounter: Payer: Self-pay | Admitting: Family Medicine

## 2023-11-19 ENCOUNTER — Ambulatory Visit

## 2023-11-19 VITALS — BP 120/76 | HR 68 | Temp 98.3°F | Resp 20 | Ht 64.0 in | Wt 205.0 lb

## 2023-11-19 DIAGNOSIS — Z1211 Encounter for screening for malignant neoplasm of colon: Secondary | ICD-10-CM

## 2023-11-19 DIAGNOSIS — R0982 Postnasal drip: Secondary | ICD-10-CM | POA: Diagnosis not present

## 2023-11-19 DIAGNOSIS — Z1231 Encounter for screening mammogram for malignant neoplasm of breast: Secondary | ICD-10-CM

## 2023-11-19 DIAGNOSIS — R058 Other specified cough: Secondary | ICD-10-CM

## 2023-11-19 DIAGNOSIS — M5416 Radiculopathy, lumbar region: Secondary | ICD-10-CM

## 2023-11-19 DIAGNOSIS — M545 Low back pain, unspecified: Secondary | ICD-10-CM | POA: Diagnosis not present

## 2023-11-19 DIAGNOSIS — M62838 Other muscle spasm: Secondary | ICD-10-CM

## 2023-11-19 DIAGNOSIS — G8929 Other chronic pain: Secondary | ICD-10-CM

## 2023-11-19 DIAGNOSIS — K219 Gastro-esophageal reflux disease without esophagitis: Secondary | ICD-10-CM

## 2023-11-19 DIAGNOSIS — F5104 Psychophysiologic insomnia: Secondary | ICD-10-CM

## 2023-11-19 DIAGNOSIS — M47817 Spondylosis without myelopathy or radiculopathy, lumbosacral region: Secondary | ICD-10-CM | POA: Diagnosis not present

## 2023-11-19 MED ORDER — DICLOFENAC SODIUM 75 MG PO TBEC: 75.0000 mg | DELAYED_RELEASE_TABLET | Freq: Two times a day (BID) | ORAL | 0 refills | Status: DC | PRN

## 2023-11-19 MED ORDER — AZELASTINE HCL 0.1 % NA SOLN: 2.0000 | Freq: Two times a day (BID) | NASAL | 1 refills | Status: DC

## 2023-11-19 MED ORDER — ZOLPIDEM TARTRATE ER 6.25 MG PO TBCR
6.2500 mg | EXTENDED_RELEASE_TABLET | Freq: Every evening | ORAL | 0 refills | Status: DC | PRN
Start: 2023-11-19 — End: 2024-05-24

## 2023-11-19 MED ORDER — CYCLOBENZAPRINE HCL 10 MG PO TABS: 10.0000 mg | ORAL_TABLET | Freq: Every day | ORAL | 1 refills | Status: DC

## 2023-11-19 MED ORDER — FLUTICASONE PROPIONATE 50 MCG/ACT NA SUSP: 2.0000 | Freq: Two times a day (BID) | NASAL | 1 refills | Status: DC

## 2023-11-19 MED ORDER — ALBUTEROL SULFATE HFA 108 (90 BASE) MCG/ACT IN AERS
2.0000 | INHALATION_SPRAY | Freq: Four times a day (QID) | RESPIRATORY_TRACT | 0 refills | Status: DC | PRN
Start: 2023-11-19 — End: 2023-12-15

## 2023-11-19 MED ORDER — CETIRIZINE HCL 10 MG PO TABS: 10.0000 mg | ORAL_TABLET | Freq: Every day | ORAL | 3 refills | Status: DC

## 2023-11-19 NOTE — Patient Instructions (Addendum)
 It was a pleasure meeting you today. Thank you for allowing me to take part in your health care.  Our goals for today as we discussed include:  Start Diclofenac 1 tablet 2 times a day for pain as needed Take Omeprazole daily Tylenol 500 mg every 6 hours as needed Lidocaine patch every 12 hours as needed Heat/Ice as needed Massage therapy Gentle low back stretching daily Referral sent to physical therapy  Will get xray of back today Recommend referral to pain management clinic if no improvement with current treatment  Monitor blood pressure at home.  Goal <150/90  Follow up in 4-6 weeks if no improvement  Start Astelin 2 sprays two times a day Start Flonase 2 sprays two times a day Start Albuterol 1 puff as needed for shortness of breath or increasing cough  Refill sent for requested medications  Referral sent for Colonoscopy  Referral sent for Mammogram. Please call to schedule appointment. [facility] [address] [place], [place] [address] [phone]     You can take Tylenol and/or Ibuprofen as needed for fever reduction and pain relief.   For cough: honey 1/2 to 1 teaspoon (you can dilute the honey in water or another fluid).  You can also use guaifenesin and dextromethorphan for cough. You can use a humidifier for chest congestion and cough.  If you don't have a humidifier, you can sit in the bathroom with the hot shower running.      For sore throat: try warm salt water gargles, cepacol lozenges, throat spray, warm tea or water with lemon/honey, popsicles or ice, or OTC cold relief medicine for throat discomfort.   For congestion: take a daily anti-histamine like Zyrtec, Claritin, and a oral decongestant, such as pseudoephedrine.  You can also use Flonase 1-2 sprays in each nostril daily.   It is important to stay hydrated: drink plenty of fluids (water, gatorade/powerade/pedialyte, juices, or teas) to keep your throat moisturized and help further  relieve irritation/discomfort.    This is a list of the screening recommended for you and due dates:  Health Maintenance  Topic Date Due   Colon Cancer Screening  Never done   Mammogram  Never done   Zoster (Shingles) Vaccine (1 of 2) Never done   Flu Shot  12/08/2023*   DTaP/Tdap/Td vaccine (6 - Td or Tdap) 12/12/2027   Hepatitis C Screening  Completed   HIV Screening  Completed   HPV Vaccine  Aged Out   COVID-19 Vaccine  Discontinued  *Topic was postponed. The date shown is not the original due date.      If you have any questions or concerns, please do not hesitate to call the office at [phone].  I look forward to our next visit and until then take care and stay safe.  Regards,   Dana Allan, MD   [facility]

## 2023-11-19 NOTE — Progress Notes (Signed)
 SUBJECTIVE:   Chief Complaint  Patient presents with   Back Pain    Since last time pt was here   Cough    X 1 month Hard to sleep    HPI Presents for acute visit   Discussed the use of AI scribe software for clinical note transcription with the patient, who gave verbal consent to proceed.  History of Present Illness The patient presents with cough and back pain.  She has been experiencing a persistent cough for about a month, producing phlegm that is sometimes yellow or green. The cough worsens at night and is associated with new-onset shortness of breath. No history of smoking, asthma, or COPD. She has been exposed to illnesses from her granddaughters, one of whom had COVID and the flu, and the other had RSV. She also reports a hoarse voice upon waking and a gradual weight loss of about five pounds. No chest pain, wheezing, or pain while breathing. She had fever and chills with the onset of her cough.  She has a history of lower back pain, specifically near her tailbone, ongoing since at least 2019. An MRI from that time showed some issues, but she has not pursued injections or physical therapy. The pain has remained the same over the years and does not radiate beyond the hips. No recent injury, numbness, tingling, or incontinence. She has been using Tylenol and ibuprofen for pain relief, but these have not been effective. Heat therapy provides temporary relief. Previously, she was prescribed Flexeril, which she took at night with minimal relief. She is currently out of Flexeril. No recent falls or fever related to the back pain.  She is currently taking Prilosec daily and has no known allergies except to bee venom. She used to use Flonase nasal spray but is not currently using it. She is taking Zyrtec.        PERTINENT PMH / PSH: As above  OBJECTIVE:  BP 120/76   Pulse 68   Temp 98.3 F (36.8 C)   Resp 20   Ht 5\' 4"  (1.626 m)   Wt 205 lb (93 kg)   LMP 03/08/2018 (Exact  Date)   SpO2 99%   BMI 35.19 kg/m    Physical Exam Vitals reviewed.  Constitutional:      General: She is not in acute distress.    Appearance: She is not ill-appearing.  HENT:     Head: Normocephalic.     Right Ear: Tympanic membrane, ear canal and external ear normal.     Left Ear: Tympanic membrane, ear canal and external ear normal.     Nose: Nose normal.     Right Turbinates: Swollen and pale.     Left Turbinates: Swollen and pale.     Mouth/Throat:     Mouth: Mucous membranes are moist.     Pharynx: Posterior oropharyngeal erythema present.  Eyes:     Extraocular Movements: Extraocular movements intact.     Conjunctiva/sclera: Conjunctivae normal.     Pupils: Pupils are equal, round, and reactive to light.  Neck:     Thyroid: No thyromegaly or thyroid tenderness.     Vascular: No carotid bruit.  Cardiovascular:     Rate and Rhythm: Normal rate and regular rhythm.     Pulses: Normal pulses.     Heart sounds: Normal heart sounds.  Pulmonary:     Effort: Pulmonary effort is normal.     Breath sounds: Normal breath sounds.  Abdominal:  General: Bowel sounds are normal. There is no distension.     Palpations: Abdomen is soft.     Tenderness: There is no abdominal tenderness. There is no right CVA tenderness, left CVA tenderness, guarding or rebound.  Musculoskeletal:        General: Normal range of motion.     Cervical back: Normal range of motion.     Right lower leg: No edema.     Left lower leg: No edema.  Lymphadenopathy:     Cervical: No cervical adenopathy.  Skin:    Capillary Refill: Capillary refill takes less than 2 seconds.  Neurological:     General: No focal deficit present.     Mental Status: She is alert and oriented to person, place, and time. Mental status is at baseline.     Motor: No weakness.  Psychiatric:        Mood and Affect: Mood normal.        Behavior: Behavior normal.        Thought Content: Thought content normal.        Judgment:  Judgment normal.           11/19/2023    9:12 AM 09/22/2023    9:01 AM 04/29/2023    9:04 AM 06/06/2022    8:41 AM 03/06/2022   10:20 AM  Depression screen PHQ 2/9  Decreased Interest 1 1 2 1 2   Down, Depressed, Hopeless 1 1 1 1 2   PHQ - 2 Score 2 2 3 2 4   Altered sleeping 3 3 3 3 3   Tired, decreased energy 3 3 3 3 3   Change in appetite 0 0 0 2 0  Feeling bad or failure about yourself  0 0 0 1 1  Trouble concentrating 0 0 0 0 1  Moving slowly or fidgety/restless 0 0 0 0 0  Suicidal thoughts 0 0 0 0 0  PHQ-9 Score 8 8 9 11 12   Difficult doing work/chores Somewhat difficult Somewhat difficult Very difficult Somewhat difficult Very difficult      09/22/2023    9:01 AM 04/29/2023    9:04 AM 03/06/2022   11:30 AM 08/28/2021    2:33 PM  GAD 7 : Generalized Anxiety Score  Nervous, Anxious, on Edge 0 1 0 1  Control/stop worrying 0 1 2 1   Worry too much - different things 0 1 3 1   Trouble relaxing 1 1 3 2   Restless 0 0 0 0  Easily annoyed or irritable 0 0 1 1  Afraid - awful might happen 0 0 0 0  Total GAD 7 Score 1 4 9 6   Anxiety Difficulty Not difficult at all Somewhat difficult Very difficult Somewhat difficult    ASSESSMENT/PLAN:  Colon cancer screening -     Ambulatory referral to Gastroenterology  Breast cancer screening by mammogram -     3D Screening Mammogram, Left and Right  Chronic bilateral low back pain without sciatica -     DG Lumbar Spine 2-3 Views -     Diclofenac Sodium; Take 1 tablet (75 mg total) by mouth 2 (two) times daily as needed.  Dispense: 60 tablet; Refill: 0 -     Ambulatory referral to Physical Therapy  Chronic insomnia -     Zolpidem Tartrate ER; Take 1 tablet (6.25 mg total) by mouth at bedtime as needed for sleep.  Dispense: 30 tablet; Refill: 0  PND (post-nasal drip) -     Fluticasone Propionate; Place 2 sprays into  both nostrils 2 (two) times daily. SHAKE LIQUID AND USE 2 SPRAYS IN EACH NOSTRIL DAILY  Dispense: 16 g; Refill: 1 -      Azelastine HCl; Place 2 sprays into both nostrils 2 (two) times daily. Use in each nostril as directed  Dispense: 30 mL; Refill: 1 -     Cetirizine HCl; Take 1 tablet (10 mg total) by mouth at bedtime. Prn  Dispense: 90 tablet; Refill: 3  Muscle spasm -     Cyclobenzaprine HCl; Take 1 tablet (10 mg total) by mouth at bedtime.  Dispense: 30 tablet; Refill: 1  Other cough -     Albuterol Sulfate HFA; Inhale 2 puffs into the lungs every 6 (six) hours as needed for wheezing or shortness of breath.  Dispense: 8 g; Refill: 0     PDMP reviewed  Return if symptoms worsen or fail to improve, for PCP.  Dana Allan, MD

## 2023-11-20 ENCOUNTER — Ambulatory Visit: Attending: Family Medicine | Admitting: Physical Therapy

## 2023-11-24 ENCOUNTER — Ambulatory Visit

## 2023-11-24 ENCOUNTER — Encounter: Payer: Self-pay | Admitting: Family Medicine

## 2023-11-24 DIAGNOSIS — Z1231 Encounter for screening mammogram for malignant neoplasm of breast: Secondary | ICD-10-CM | POA: Insufficient documentation

## 2023-11-24 DIAGNOSIS — R059 Cough, unspecified: Secondary | ICD-10-CM | POA: Insufficient documentation

## 2023-11-24 DIAGNOSIS — Z1211 Encounter for screening for malignant neoplasm of colon: Secondary | ICD-10-CM | POA: Insufficient documentation

## 2023-11-24 DIAGNOSIS — G8929 Other chronic pain: Secondary | ICD-10-CM | POA: Insufficient documentation

## 2023-11-24 DIAGNOSIS — Z0000 Encounter for general adult medical examination without abnormal findings: Secondary | ICD-10-CM | POA: Insufficient documentation

## 2023-11-24 NOTE — Assessment & Plan Note (Signed)
 Chronic lower back pain since 2019, unrelieved by OTC medications. Flexeril provides some relief. No recent imaging or specialist intervention. - Order back x-ray to assess for changes since last imaging. - Prescribe stronger anti-inflammatory medication, twice daily initially, then as needed. - Refill Flexeril for nighttime use. - Refer to physical therapy. - Refer to pain management for potential interventions, including injections.

## 2023-11-24 NOTE — Assessment & Plan Note (Signed)
 Continues omeprazole daily, especially with new anti-inflammatory medication. - Ensure daily use of omeprazole with new anti-inflammatory medication.

## 2023-11-24 NOTE — Assessment & Plan Note (Signed)
 Persistent cough with sputum and shortness of breath likely due to post-viral cough or post-nasal drip. No indication for antibiotics or antivirals. - Prescribe Flonase nasal spray, two sprays twice daily. - Prescribe Astelin nasal spray, two sprays twice daily. - Prescribe albuterol inhaler, one puff every six hours as needed for shortness of breath. - Advise on inhaler technique.

## 2023-11-24 NOTE — Assessment & Plan Note (Signed)
 Refill Ambien CR 6.25 mg at night.

## 2023-12-01 ENCOUNTER — Encounter: Admitting: Physical Therapy

## 2023-12-01 ENCOUNTER — Encounter: Payer: Self-pay | Admitting: *Deleted

## 2023-12-10 ENCOUNTER — Encounter

## 2023-12-12 ENCOUNTER — Other Ambulatory Visit: Payer: Self-pay | Admitting: Family Medicine

## 2023-12-12 DIAGNOSIS — R058 Other specified cough: Secondary | ICD-10-CM

## 2023-12-15 ENCOUNTER — Encounter: Admitting: Physical Therapy

## 2023-12-24 ENCOUNTER — Encounter

## 2023-12-31 ENCOUNTER — Encounter: Admitting: Physical Therapy

## 2024-01-06 ENCOUNTER — Encounter: Admitting: Physical Therapy

## 2024-01-08 ENCOUNTER — Ambulatory Visit: Admitting: Family Medicine

## 2024-01-08 ENCOUNTER — Ambulatory Visit (INDEPENDENT_AMBULATORY_CARE_PROVIDER_SITE_OTHER): Admitting: Family Medicine

## 2024-01-08 ENCOUNTER — Encounter: Payer: Self-pay | Admitting: Family Medicine

## 2024-01-08 VITALS — BP 136/66 | HR 83 | Temp 97.9°F | Resp 20 | Ht 64.0 in | Wt 206.4 lb

## 2024-01-08 DIAGNOSIS — E611 Iron deficiency: Secondary | ICD-10-CM | POA: Diagnosis not present

## 2024-01-08 DIAGNOSIS — I10 Essential (primary) hypertension: Secondary | ICD-10-CM | POA: Diagnosis not present

## 2024-01-08 DIAGNOSIS — M5416 Radiculopathy, lumbar region: Secondary | ICD-10-CM

## 2024-01-08 DIAGNOSIS — M545 Low back pain, unspecified: Secondary | ICD-10-CM | POA: Diagnosis not present

## 2024-01-08 DIAGNOSIS — H1013 Acute atopic conjunctivitis, bilateral: Secondary | ICD-10-CM | POA: Diagnosis not present

## 2024-01-08 DIAGNOSIS — R0982 Postnasal drip: Secondary | ICD-10-CM

## 2024-01-08 DIAGNOSIS — E559 Vitamin D deficiency, unspecified: Secondary | ICD-10-CM

## 2024-01-08 MED ORDER — KETOROLAC TROMETHAMINE 60 MG/2ML IM SOLN: 60.0000 mg | Freq: Once | INTRAMUSCULAR | Status: DC

## 2024-01-08 MED ORDER — FLUTICASONE PROPIONATE 50 MCG/ACT NA SUSP: 2.0000 | Freq: Two times a day (BID) | NASAL | 1 refills | Status: DC

## 2024-01-08 MED ORDER — METHOCARBAMOL 750 MG PO TABS: 750.0000 mg | ORAL_TABLET | Freq: Four times a day (QID) | ORAL | 1 refills | Status: DC

## 2024-01-08 MED ORDER — PREDNISONE 50 MG PO TABS: ORAL_TABLET | ORAL | 0 refills | Status: DC

## 2024-01-08 MED ORDER — OLOPATADINE HCL 0.1 % OP SOLN: 1.0000 [drp] | Freq: Two times a day (BID) | OPHTHALMIC | 0 refills | Status: DC

## 2024-01-08 MED ORDER — KETOROLAC TROMETHAMINE 60 MG/2ML IM SOLN
60.0000 mg | Freq: Once | INTRAMUSCULAR | Status: AC
  Administered 2024-01-08: 60 mg via INTRAMUSCULAR

## 2024-01-08 NOTE — Progress Notes (Signed)
 SUBJECTIVE:   Chief Complaint  Patient presents with   Back Pain   HPI Presents to clinic for acute visit  Discussed the use of AI scribe software for clinical note transcription with the patient, who gave verbal consent to proceed.  History of Present Illness Trene Biehn is a 53 year old female with arthritis who presents with back pain radiating to her hips and legs.  She experiences persistent back pain radiating to her hips and down her legs. The pain is described as 'unexplainable' and has not improved with Flexeril , which she states is no longer effective. She has not attended physical therapy due to a personal emergency involving her daughter.  She reports burning and painful sensations in her eyes, which began about two weeks ago. The eyes itch and feel like they are on fire, with the left eye sometimes being closed together in the morning. Over-the-counter allergy drops have not provided relief. No drainage or redness is noted, but she has a history of allergies. She experienced double vision yesterday, with symptoms worsening midday.  No fever or recent eye injuries are reported.    PERTINENT PMH / PSH: As above  OBJECTIVE:  BP 136/66   Pulse 83   Temp 97.9 F (36.6 C)   Resp 20   Ht 5\' 4"  (1.626 m)   Wt 206 lb 6 oz (93.6 kg)   LMP 03/08/2018 (Exact Date)   SpO2 99%   BMI 35.42 kg/m    Physical Exam Vitals reviewed.  Constitutional:      General: She is not in acute distress.    Appearance: Normal appearance. She is normal weight. She is not ill-appearing, toxic-appearing or diaphoretic.  Eyes:     General:        Right eye: No discharge.        Left eye: No discharge.     Conjunctiva/sclera: Conjunctivae normal.  Cardiovascular:     Rate and Rhythm: Normal rate and regular rhythm.     Heart sounds: Normal heart sounds.  Pulmonary:     Effort: Pulmonary effort is normal.     Breath sounds: Normal breath sounds.  Abdominal:     General:  Bowel sounds are normal.  Musculoskeletal:        General: Normal range of motion.     Right hip: No bony tenderness. Normal strength.     Left hip: No bony tenderness. Normal strength.  Skin:    General: Skin is warm and dry.  Neurological:     General: No focal deficit present.     Mental Status: She is alert and oriented to person, place, and time. Mental status is at baseline.  Psychiatric:        Mood and Affect: Mood normal.        Behavior: Behavior normal.        Thought Content: Thought content normal.        Judgment: Judgment normal.           01/08/2024    1:40 PM 11/19/2023    9:12 AM 09/22/2023    9:01 AM 04/29/2023    9:04 AM 06/06/2022    8:41 AM  Depression screen PHQ 2/9  Decreased Interest 2 1 1 2 1   Down, Depressed, Hopeless 1 1 1 1 1   PHQ - 2 Score 3 2 2 3 2   Altered sleeping 3 3 3 3 3   Tired, decreased energy 3 3 3 3 3   Change in appetite  0 0 0 0 2  Feeling bad or failure about yourself  0 0 0 0 1  Trouble concentrating 0 0 0 0 0  Moving slowly or fidgety/restless 0 0 0 0 0  Suicidal thoughts 0 0 0 0 0  PHQ-9 Score 9 8 8 9 11   Difficult doing work/chores Somewhat difficult Somewhat difficult Somewhat difficult Very difficult Somewhat difficult      01/08/2024    1:41 PM 09/22/2023    9:01 AM 04/29/2023    9:04 AM 03/06/2022   11:30 AM  GAD 7 : Generalized Anxiety Score  Nervous, Anxious, on Edge 0 0 1 0  Control/stop worrying 1 0 1 2  Worry too much - different things 0 0 1 3  Trouble relaxing 2 1 1 3   Restless 0 0 0 0  Easily annoyed or irritable 0 0 0 1  Afraid - awful might happen 0 0 0 0  Total GAD 7 Score 3 1 4 9   Anxiety Difficulty Somewhat difficult Not difficult at all Somewhat difficult Very difficult    ASSESSMENT/PLAN:  Acute bilateral low back pain without sciatica Assessment & Plan: Acute exacerbation of chronic lower back pain, worse than previous episodes. Pain radiates to the hip but not down the legs. No associated fever,  urinary symptoms, or neurologic symptoms. Pain exacerbated by movement and not relieved by over-the-counter analgesics or heat application. -Order lumbar spine X-ray. -Administer Toradol  30 mg injection for immediate pain relief. -Prescribe Naproxen  for ongoing pain management, to be started 8 hours after Toradol  injection. -Refill Flexeril  prescription, to be taken at night. -Schedule follow-up appointment in 2 weeks to reassess pain.  Orders: -     predniSONE ; Take 1 tablet daily  Dispense: 5 tablet; Refill: 0 -     Methocarbamol ; Take 1 tablet (750 mg total) by mouth 4 (four) times daily.  Dispense: 120 tablet; Refill: 1 -     Ketorolac  Tromethamine   Lumbar radiculopathy -     Ambulatory referral to Sports Medicine  Allergic conjunctivitis of both eyes Assessment & Plan: Bilateral eye pain and itching likely due to allergies. Differential includes allergic conjunctivitis. No signs of bacterial conjunctivitis. Eye exam benign today. - Prescribe Pataday  drops for ocular itching and burning. - Instruct to report any yellow drainage or eyes sticking together for further evaluation.  Orders: -     Olopatadine  HCl; Place 1 drop into both eyes 2 (two) times daily.  Dispense: 5 mL; Refill: 0  PND (post-nasal drip) -     Fluticasone  Propionate; Place 2 sprays into both nostrils 2 (two) times daily. SHAKE LIQUID AND USE 2 SPRAYS IN EACH NOSTRIL DAILY  Dispense: 16 g; Refill: 1  Essential hypertension Assessment & Plan: Well controlled today -Continue Amlodipine  10 mg daily.   -Monitor BP at home, goal <150/90  Orders: -     Comprehensive metabolic panel with GFR  Iron  deficiency -     IBC + Ferritin  Vitamin D  deficiency -     VITAMIN D  25 Hydroxy (Vit-D Deficiency, Fractures)    PDMP reviewed  Return if symptoms worsen or fail to improve, for PCP.  Stacey Gaw, MD

## 2024-01-08 NOTE — Patient Instructions (Addendum)
 It was a pleasure meeting you today. Thank you for allowing me to take part in your health care.  Our goals for today as we discussed include:  Received Toradol  60 mg injection today Do not take any Ibuprofen , Diclofenac , Aleve  or other NSAIDs  today   Start Prednisone  50 mg daily.  Start this tomorrow  Referral sent to [facility] for back pain Dr Donella Fulling [address] [place], [place], [place] [address] Phone: [phone]   Use Pataday  eye drops for itching eyes.  If no improvement please schedule appointment with Eye clinic    This is a list of the screening recommended for you and due dates:  Health Maintenance  Topic Date Due   Colon Cancer Screening  Never done   Mammogram  Never done   Zoster (Shingles) Vaccine (1 of 2) Never done   Flu Shot  04/09/2024   DTaP/Tdap/Td vaccine (6 - Td or Tdap) 12/12/2027   Hepatitis C Screening  Completed   HIV Screening  Completed   HPV Vaccine  Aged Out   Meningitis B Vaccine  Aged Out   COVID-19 Vaccine  Discontinued     If you have any questions or concerns, please do not hesitate to call the office at [phone].  I look forward to our next visit and until then take care and stay safe.  Regards,   Valli Gaw, MD   [facility]

## 2024-01-09 ENCOUNTER — Encounter: Admitting: Physical Therapy

## 2024-01-09 ENCOUNTER — Other Ambulatory Visit: Payer: Self-pay | Admitting: Family Medicine

## 2024-01-09 DIAGNOSIS — E559 Vitamin D deficiency, unspecified: Secondary | ICD-10-CM

## 2024-01-09 LAB — COMPREHENSIVE METABOLIC PANEL WITH GFR
ALT: 10 U/L (ref 0–35)
AST: 14 U/L (ref 0–37)
Albumin: 4 g/dL (ref 3.5–5.2)
Alkaline Phosphatase: 54 U/L (ref 39–117)
BUN: 10 mg/dL (ref 6–23)
CO2: 31 meq/L (ref 19–32)
Calcium: 9.3 mg/dL (ref 8.4–10.5)
Chloride: 104 meq/L (ref 96–112)
Creatinine, Ser: 0.86 mg/dL (ref 0.40–1.20)
GFR: 76.46 mL/min (ref >60.00)
Glucose, Bld: 82 mg/dL (ref 70–99)
Potassium: 4.4 meq/L (ref 3.5–5.1)
Sodium: 141 meq/L (ref 135–145)
Total Bilirubin: 0.3 mg/dL (ref 0.2–1.2)
Total Protein: 6.5 g/dL (ref 6.0–8.3)

## 2024-01-09 LAB — IBC + FERRITIN
Ferritin: 30.9 ng/mL (ref 10.0–291.0)
Iron: 64 ug/dL (ref 42–145)
Saturation Ratios: 20.2 % (ref 20.0–50.0)
TIBC: 316.4 ug/dL (ref 250.0–450.0)
Transferrin: 226 mg/dL (ref 212.0–360.0)

## 2024-01-09 LAB — VITAMIN D 25 HYDROXY (VIT D DEFICIENCY, FRACTURES): VITD: 16.35 ng/mL — ABNORMAL LOW (ref 30.00–100.00)

## 2024-01-09 MED ORDER — VITAMIN D (ERGOCALCIFEROL) 1.25 MG (50000 UNIT) PO CAPS: 50000.0000 [IU] | ORAL_CAPSULE | ORAL | 1 refills | Status: DC

## 2024-01-12 ENCOUNTER — Encounter

## 2024-01-14 ENCOUNTER — Encounter

## 2024-01-16 ENCOUNTER — Encounter: Payer: Self-pay | Admitting: Family Medicine

## 2024-01-16 DIAGNOSIS — H1010 Acute atopic conjunctivitis, unspecified eye: Secondary | ICD-10-CM

## 2024-01-16 HISTORY — DX: Acute atopic conjunctivitis, unspecified eye: H10.10

## 2024-01-16 NOTE — Assessment & Plan Note (Signed)
>>  ASSESSMENT AND PLAN FOR CHRONIC LOW BACK PAIN WITH SCIATICA WRITTEN ON 01/16/2024 12:06 PM BY WALSH, TANYA, MD  Chronic back pain with radiation to hips and legs, associated with mild degenerative changes. Flexeril  ineffective. Discussed chronic nature of arthritic pain and management goals. Considered diclofenac , gabapentin , Celebrex, and referral to sports medicine for shockwave therapy. Emphasized weight management and activity. - Discontinue Flexeril . - Try Methocarbamol   - Prescribe a short course of prednisone  to reduce inflammation. - Administer a Toradol  injection today. - Refer to sports medicine for further evaluation

## 2024-01-16 NOTE — Assessment & Plan Note (Signed)
 Bilateral eye pain and itching likely due to allergies. Differential includes allergic conjunctivitis. No signs of bacterial conjunctivitis. Eye exam benign today. - Prescribe Pataday  drops for ocular itching and burning. - Instruct to report any yellow drainage or eyes sticking together for further evaluation.

## 2024-01-16 NOTE — Assessment & Plan Note (Signed)
 Well controlled today -Continue Amlodipine  10 mg daily.   -Monitor BP at home, goal <150/90

## 2024-01-16 NOTE — Assessment & Plan Note (Signed)
 Chronic back pain with radiation to hips and legs, associated with mild degenerative changes. Flexeril  ineffective. Discussed chronic nature of arthritic pain and management goals. Considered diclofenac , gabapentin , Celebrex, and referral to sports medicine for shockwave therapy. Emphasized weight management and activity. - Discontinue Flexeril . - Try Methocarbamol   - Prescribe a short course of prednisone  to reduce inflammation. - Administer a Toradol  injection today. - Refer to sports medicine for further evaluation

## 2024-01-16 NOTE — Assessment & Plan Note (Signed)
 Acute exacerbation of chronic lower back pain, worse than previous episodes. Pain radiates to the hip but not down the legs. No associated fever, urinary symptoms, or neurologic symptoms. Pain exacerbated by movement and not relieved by over-the-counter analgesics or heat application. -Order lumbar spine X-ray. -Administer Toradol  30 mg injection for immediate pain relief. -Prescribe Naproxen  for ongoing pain management, to be started 8 hours after Toradol  injection. -Refill Flexeril  prescription, to be taken at night. -Schedule follow-up appointment in 2 weeks to reassess pain.

## 2024-01-17 ENCOUNTER — Other Ambulatory Visit: Payer: Self-pay | Admitting: Family Medicine

## 2024-01-17 DIAGNOSIS — G8929 Other chronic pain: Secondary | ICD-10-CM

## 2024-01-19 ENCOUNTER — Encounter

## 2024-01-21 ENCOUNTER — Encounter: Admitting: Physical Therapy

## 2024-01-26 ENCOUNTER — Encounter

## 2024-01-29 ENCOUNTER — Encounter

## 2024-02-03 ENCOUNTER — Encounter

## 2024-02-05 ENCOUNTER — Encounter

## 2024-02-10 ENCOUNTER — Encounter

## 2024-02-12 ENCOUNTER — Encounter

## 2024-02-17 ENCOUNTER — Encounter

## 2024-02-19 ENCOUNTER — Encounter

## 2024-02-24 ENCOUNTER — Encounter

## 2024-02-26 ENCOUNTER — Encounter

## 2024-05-12 ENCOUNTER — Other Ambulatory Visit: Payer: Self-pay

## 2024-05-12 DIAGNOSIS — F39 Unspecified mood [affective] disorder: Secondary | ICD-10-CM

## 2024-05-12 MED ORDER — SERTRALINE HCL 100 MG PO TABS: 150.0000 mg | ORAL_TABLET | Freq: Every day | ORAL | 0 refills | Status: DC

## 2024-05-24 ENCOUNTER — Ambulatory Visit

## 2024-05-24 VITALS — BP 138/66 | HR 63 | Temp 99.0°F | Ht 64.0 in | Wt 201.2 lb

## 2024-05-24 DIAGNOSIS — R0982 Postnasal drip: Secondary | ICD-10-CM

## 2024-05-24 DIAGNOSIS — M545 Low back pain, unspecified: Secondary | ICD-10-CM

## 2024-05-24 DIAGNOSIS — E785 Hyperlipidemia, unspecified: Secondary | ICD-10-CM

## 2024-05-24 DIAGNOSIS — E782 Mixed hyperlipidemia: Secondary | ICD-10-CM | POA: Diagnosis not present

## 2024-05-24 DIAGNOSIS — F5101 Primary insomnia: Secondary | ICD-10-CM

## 2024-05-24 DIAGNOSIS — E559 Vitamin D deficiency, unspecified: Secondary | ICD-10-CM

## 2024-05-24 DIAGNOSIS — K219 Gastro-esophageal reflux disease without esophagitis: Secondary | ICD-10-CM

## 2024-05-24 DIAGNOSIS — F39 Unspecified mood [affective] disorder: Secondary | ICD-10-CM

## 2024-05-24 DIAGNOSIS — Z1231 Encounter for screening mammogram for malignant neoplasm of breast: Secondary | ICD-10-CM

## 2024-05-24 DIAGNOSIS — R7309 Other abnormal glucose: Secondary | ICD-10-CM

## 2024-05-24 DIAGNOSIS — E66811 Obesity, class 1: Secondary | ICD-10-CM

## 2024-05-24 DIAGNOSIS — G8929 Other chronic pain: Secondary | ICD-10-CM

## 2024-05-24 DIAGNOSIS — Z1211 Encounter for screening for malignant neoplasm of colon: Secondary | ICD-10-CM

## 2024-05-24 DIAGNOSIS — E611 Iron deficiency: Secondary | ICD-10-CM

## 2024-05-24 DIAGNOSIS — J301 Allergic rhinitis due to pollen: Secondary | ICD-10-CM

## 2024-05-24 DIAGNOSIS — F5104 Psychophysiologic insomnia: Secondary | ICD-10-CM

## 2024-05-24 DIAGNOSIS — I10 Essential (primary) hypertension: Secondary | ICD-10-CM | POA: Diagnosis not present

## 2024-05-24 LAB — LIPID PANEL
Cholesterol: 182 mg/dL (ref 0–200)
HDL: 42.4 mg/dL (ref >39.00)
LDL Cholesterol: 119 mg/dL — ABNORMAL HIGH (ref 0–99)
NonHDL: 139.41
Total CHOL/HDL Ratio: 4
Triglycerides: 102 mg/dL (ref 0.0–149.0)
VLDL: 20.4 mg/dL (ref 0.0–40.0)

## 2024-05-24 LAB — VITAMIN D 25 HYDROXY (VIT D DEFICIENCY, FRACTURES): VITD: 11.9 ng/mL — ABNORMAL LOW (ref 30.00–100.00)

## 2024-05-24 LAB — HEMOGLOBIN A1C: Hgb A1c MFr Bld: 6 % (ref 4.6–6.5)

## 2024-05-24 MED ORDER — AMLODIPINE BESYLATE 2.5 MG PO TABS: 2.5000 mg | ORAL_TABLET | Freq: Every day | ORAL | 3 refills | Status: DC

## 2024-05-24 MED ORDER — MELATONIN 3 MG PO TABS: 3.0000 mg | ORAL_TABLET | Freq: Every day | ORAL | 1 refills | Status: DC

## 2024-05-24 MED ORDER — DICLOFENAC SODIUM 1 % EX GEL: 2.0000 g | Freq: Four times a day (QID) | CUTANEOUS | 5 refills | Status: DC

## 2024-05-24 MED ORDER — SERTRALINE HCL 100 MG PO TABS: 150.0000 mg | ORAL_TABLET | Freq: Every day | ORAL | 3 refills | Status: DC

## 2024-05-24 MED ORDER — FLUTICASONE PROPIONATE 50 MCG/ACT NA SUSP: 2.0000 | Freq: Two times a day (BID) | NASAL | 3 refills | Status: DC

## 2024-05-24 MED ORDER — ROSUVASTATIN CALCIUM 10 MG PO TABS: 10.0000 mg | ORAL_TABLET | Freq: Every evening | ORAL | 3 refills | Status: DC

## 2024-05-24 MED ORDER — OMEPRAZOLE 40 MG PO CPDR: 40.0000 mg | DELAYED_RELEASE_CAPSULE | Freq: Every day | ORAL | 3 refills | Status: DC | PRN

## 2024-05-24 MED ORDER — IRON (FERROUS SULFATE) 325 (65 FE) MG PO TABS: 325.0000 mg | ORAL_TABLET | Freq: Every day | ORAL | 3 refills | Status: DC

## 2024-05-24 MED ORDER — CETIRIZINE HCL 10 MG PO TABS: 10.0000 mg | ORAL_TABLET | Freq: Every day | ORAL | 3 refills | Status: DC

## 2024-05-24 NOTE — Assessment & Plan Note (Signed)
Referral for screening colonoscopy done 

## 2024-05-24 NOTE — Assessment & Plan Note (Signed)
-   Managed with Zoloft  150 mg daily, reviewed PHQ9, GAD7, refill sent.

## 2024-05-24 NOTE — Assessment & Plan Note (Signed)
 Previous A1c in prediabetic range, requires monitoring. Order A1c test. Advise reducing carbohydrate-rich foods.

## 2024-05-24 NOTE — Assessment & Plan Note (Signed)
 Use omeprazole  daily 40 mg on an empty stomach as needed. Advise to avoid taking if not needed

## 2024-05-24 NOTE — Assessment & Plan Note (Signed)
 Chronic lower back pain since 2019. Previously prescribed muscle relaxant without significant relief.  Prescribe Voltaren  gel for lower back pain up to four times a day. Recommend using heating pad and ice. Avoid activities that trigger pain, however work requires her to be on her feet for 11-12 hours during her shift. Consider physical therapy and PMR referral if no improvement in 4-5 months.

## 2024-05-24 NOTE — Assessment & Plan Note (Signed)
 Screening mammogram ordered, patient provided with phone number and counseled to call the number to schedule for an appointment.

## 2024-05-24 NOTE — Assessment & Plan Note (Signed)
 Order vitamin D  level. If low, prescribe once weekly vitamin D  supplement

## 2024-05-24 NOTE — Assessment & Plan Note (Signed)
 Mixed hyperlipidemia. She was prescribed Crestor  10 mg, which she is not taking. Discussed importance of medication adherence, continue Crestor  10 mg at bedtime, refills sent. Check lipid panel.- Encourage daily exercise for 30 minutes. Advise reducing processed foods.

## 2024-05-24 NOTE — Assessment & Plan Note (Signed)
 Lifestyle modifications including diet, regular moderate intensity exercise for 30 min per day or 150 min per week discussed.

## 2024-05-24 NOTE — Assessment & Plan Note (Signed)
 Blood pressure well-controlled with current medication. Continue amlodipine  2.5 mg once daily. Refill sent.

## 2024-05-24 NOTE — Assessment & Plan Note (Signed)
 Improved iron  levels with oral supplementation. Continue daily iron  65 mg daily, if not able to tolerate once daily take iron  tab every other day.

## 2024-05-24 NOTE — Assessment & Plan Note (Signed)
 Symptoms include runny nose and itchy eyes. - Recommend saline nasal rinse (neti pot). - Use nasal Flonase  two puffs in each nostril daily for 7-10 days when symptoms are worse. - Use OTC Pataday  eye drops for itchy eyes. - Continue Zyrtec  daily as needed. - Advise purchasing over-the-counter if insurance does not cover.

## 2024-05-24 NOTE — Patient Instructions (Addendum)
-   For allergy: Try neti-pot, saline nasal rinse and use nasal flonase , 2 puffs in each nostril daily for 7-10 days in a row when allergy symptoms are worse. When you have itchy eyes related to allergy, you can get over the counter Pataday  eye drop and use it one drop in each eye daily when you have symptoms.  Take Zyrtec  daily.    - Continue taking over the counter iron  daily. If you are not able to take it daily, you can take it every other day.   - I am checking your vitamin D  level. If it comes back low, I will send you prescription for once weekly vitamin D  supplement.   - I am sending refill of Zoloft  (150 mg once daily), Amlodipine  2.5 mg once daily. For cholesterol, please take Crestor  10 mg at night time. I am checking your cholesterol and blood glucose today.   - For acid reflux: Take Prilosec 1 tab in an empty stomach daily as needed. If you don't need please don't take it.   - For sleep: try Melatonin 3 mg, an hour before bedtime.   - YOUR MAMMOGRAM IS DUE, PLEASE CALL AND GET THIS SCHEDULED! [facility] - call [phone]   - I have sent a referral to update colonoscopy. You will get a call to update colonoscopy.   - If you want to update flu, pneumonia and shingles vaccine I would check with your pharmacy when you go to pick up your medication.   - For lower back pain, spasm: Try Voltaren  gel up to 4 times a day, use heating pad, ice when pain is severe, avoid activities that triggers pain.
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
## 2024-05-24 NOTE — Assessment & Plan Note (Signed)
 Previously treated with Ambien  CR 6.25 mg at night, patient reports she is not taking this medication. I do not recommend prolonged use of Ambien  for sleep disorder at this time. Prescribe melatonin 3 mg one hour before bedtime. Discussed sleep hygiene today. Consider Trazodone if melatonin does not help with sleep.

## 2024-05-25 ENCOUNTER — Ambulatory Visit: Payer: Self-pay

## 2024-05-25 DIAGNOSIS — E559 Vitamin D deficiency, unspecified: Secondary | ICD-10-CM

## 2024-05-25 MED ORDER — VITAMIN D (ERGOCALCIFEROL) 1.25 MG (50000 UNIT) PO CAPS: 50000.0000 [IU] | ORAL_CAPSULE | ORAL | 3 refills | Status: DC

## 2024-05-25 MED ORDER — FLUTICASONE PROPIONATE 50 MCG/ACT NA SUSP: 2.0000 | Freq: Two times a day (BID) | NASAL | 3 refills | Status: DC

## 2024-05-25 NOTE — Addendum Note (Signed)
 Addended by: ANICE BELT on: 05/25/2024 09:44 AM   Modules accepted: Orders

## 2024-05-31 ENCOUNTER — Other Ambulatory Visit (HOSPITAL_COMMUNITY): Payer: Self-pay

## 2024-06-01 ENCOUNTER — Telehealth: Payer: Self-pay

## 2024-06-01 ENCOUNTER — Other Ambulatory Visit (HOSPITAL_COMMUNITY): Payer: Self-pay

## 2024-06-01 NOTE — Telephone Encounter (Signed)
 Pharmacy Patient Advocate Encounter   Received notification from Onbase that prior authorization for OMEPRAZOLE  40MG  is required/requested.   Insurance verification completed.   The patient is insured through [employer] .   Per test claim: Per test claim, medication is not covered due to plan/benefit exclusion, PA not submitted at this time

## 2024-06-01 NOTE — Telephone Encounter (Signed)
 If medication is not covered by the insurance, I recommend taking Omeprazole  over the counter since it is available OTC.   Thank you,  Luke Shade, MD

## 2024-06-10 NOTE — Telephone Encounter (Signed)
 Called patient to make her aware of Dr Graylon recommendations. Unable to leave message due to mailbox being full.

## 2024-09-01 ENCOUNTER — Ambulatory Visit: Payer: Self-pay

## 2024-09-01 ENCOUNTER — Telehealth: Admitting: Physician Assistant

## 2024-09-01 DIAGNOSIS — H6691 Otitis media, unspecified, right ear: Secondary | ICD-10-CM

## 2024-09-01 DIAGNOSIS — B9689 Other specified bacterial agents as the cause of diseases classified elsewhere: Secondary | ICD-10-CM | POA: Diagnosis not present

## 2024-09-01 DIAGNOSIS — J069 Acute upper respiratory infection, unspecified: Secondary | ICD-10-CM

## 2024-09-01 MED ORDER — PREDNISONE 10 MG (21) PO TBPK: ORAL_TABLET | ORAL | 0 refills | Status: DC

## 2024-09-01 MED ORDER — BENZONATATE 100 MG PO CAPS: 100.0000 mg | ORAL_CAPSULE | Freq: Three times a day (TID) | ORAL | 0 refills | Status: DC | PRN

## 2024-09-01 MED ORDER — AMOXICILLIN-POT CLAVULANATE 875-125 MG PO TABS: 1.0000 | ORAL_TABLET | Freq: Two times a day (BID) | ORAL | 0 refills | Status: DC

## 2024-09-01 NOTE — Telephone Encounter (Signed)
 FYI Only or Action Required?: Action required by provider: request for appointment.  Patient was last seen in primary care on 05/24/2024 by Abbey Bruckner, MD.  Called Nurse Triage reporting Adenopathy.  Symptoms began a week ago.  Interventions attempted: Rest, hydration, or home remedies.  Symptoms are: unchanged. Swollen node to right side of neck, sore throat. [facility] V[facility] made.  Triage Disposition: See Physician Within 24 Hours  Patient/caregiver understands and will follow disposition?: Yes      Copied from CRM #[id number]. Topic: Clinical - Red Word Triage >> Sep 01, 2024  8:38 AM Franky GRADE wrote: Red Word that prompted transfer to Nurse Triage: Patient is experiencing swollen lymph nodes on the right side of her neck. Reason for Disposition  [1] Tender node in the neck AND [2] also has a sore throat AND [3] minimal/no runny nose or cough  Answer Assessment - Initial Assessment Questions 1. LOCATION: Where is the swollen node located? Is the matching node on the other side of the body also swollen?      Right neck 2. SIZE: How big is the node? (e.g., inches or centimeters; or compared to common objects such as pea, bean, marble, golf ball)      By ear, dime size 3. ONSET: When did the swelling start?      Last week 4. NECK NODES: Is there a sore throat, runny nose or other symptoms of a cold?      Sore throat 5. GROIN OR ARMPIT NODES: Is there a sore, scratch, cut or painful red area on that arm or leg?      no 6. FEVER: Do you have a fever? If Yes, ask: What is it, how was it measured, and when did it start?      no 7. CAUSE: What do you think is causing the swollen lymph nodes?     unsure 8. OTHER SYMPTOMS: Do you have any other symptoms? (e.g., node is tender to touch, skin redness over node, weight changes)     no 9. PREGNANCY: Is there any chance you are pregnant? When was your last menstrual period?     no  Protocols used: Lymph Nodes -  Swollen-A-AH

## 2024-09-01 NOTE — Patient Instructions (Signed)
 " Delon Karna Level, thank you for joining Elsie Velma Lunger, PA-C for today's virtual visit.  While this provider is not your primary 2care provider (PCP), if your PCP is located in our provider database this encounter information will be shared with them immediately following your visit.   A [facility] MyChart account gives you access to today's visit and all your visits, tests, and labs performed at [facility]  click here if you don't have a [facility] MyChart account or go to [email or web address]  Consent: (Patient) Stacey Roberson provided verbal consent for this virtual visit at the beginning of the encounter.  Current Medications:  Current Outpatient Medications:    albuterol  (VENTOLIN  HFA) 108 (90 Base) MCG/ACT inhaler, INHALE 2 PUFFS INTO THE LUNGS EVERY 6 HOURS AS NEEDED FOR WHEEZING OR SHORTNESS OF BREATH, Disp: 6.7 g, Rfl: 2   amLODipine  (NORVASC ) 2.5 MG tablet, Take 1 tablet (2.5 mg total) by mouth daily., Disp: 90 tablet, Rfl: 3   cetirizine  (ZYRTEC ) 10 MG tablet, Take 1 tablet (10 mg total) by mouth at bedtime. Prn, Disp: 90 tablet, Rfl: 3   diclofenac  Sodium (VOLTAREN ) 1 % GEL, Apply 2 g topically 4 (four) times daily., Disp: 100 g, Rfl: 5   fluticasone  (FLONASE ) 50 MCG/ACT nasal spray, Place 2 sprays into both nostrils 2 (two) times daily., Disp: 16 g, Rfl: 3   Iron , Ferrous Sulfate , 325 (65 Fe) MG TABS, Take 325 mg by mouth daily., Disp: 90 tablet, Rfl: 3   melatonin 3 MG TABS tablet, Take 1 tablet (3 mg total) by mouth at bedtime., Disp: 90 tablet, Rfl: 1   olopatadine  (PATADAY ) 0.1 % ophthalmic solution, Place 1 drop into both eyes 2 (two) times daily., Disp: 5 mL, Rfl: 0   omeprazole  (PRILOSEC) 40 MG capsule, Take 1 capsule (40 mg total) by mouth daily as needed. On empty stomach 30 min before food, Disp: 90 capsule, Rfl: 3   rosuvastatin  (CRESTOR ) 10 MG tablet, Take 1 tablet (10 mg total) by mouth every evening., Disp: 90 tablet, Rfl: 3    sertraline  (ZOLOFT ) 100 MG tablet, Take 1.5 tablets (150 mg total) by mouth daily., Disp: 135 tablet, Rfl: 3   Vitamin D , Ergocalciferol , (DRISDOL ) 1.25 MG (50000 UNIT) CAPS capsule, Take 1 capsule (50,000 Units total) by mouth every 7 (seven) days., Disp: 12 capsule, Rfl: 3   Medications ordered in this encounter:  No orders of the defined types were placed in this encounter.    *If you need refills on other medications prior to your next appointment, please contact your pharmacy*  Follow-Up: Call back or seek an in-person evaluation if the symptoms worsen or if the condition fails to improve as anticipated.  [facility] [phone]  Other Instructions Please take antibiotic as directed.  Increase fluid intake.  Use Saline nasal spray.  Take a daily multivitamin. Take all prescribed medications as directed.  Place a humidifier in the bedroom.  Please call or return clinic if symptoms are not improving.  Sinusitis Sinusitis is redness, soreness, and swelling (inflammation) of the paranasal sinuses. Paranasal sinuses are air pockets within the bones of your face (beneath the eyes, the middle of the forehead, or above the eyes). In healthy paranasal sinuses, mucus is able to drain out, and air is able to circulate through them by way of your nose. However, when your paranasal sinuses are inflamed, mucus and air can become trapped. This can allow bacteria and other germs to grow and cause  infection. Sinusitis can develop quickly and last only a short time (acute) or continue over a long period (chronic). Sinusitis that lasts for more than 12 weeks is considered chronic.  CAUSES  Causes of sinusitis include: Allergies. Structural abnormalities, such as displacement of the cartilage that separates your nostrils (deviated septum), which can decrease the air flow through your nose and sinuses and affect sinus drainage. Functional abnormalities, such as when the small hairs (cilia)  that line your sinuses and help remove mucus do not work properly or are not present. SYMPTOMS  Symptoms of acute and chronic sinusitis are the same. The primary symptoms are pain and pressure around the affected sinuses. Other symptoms include: Upper toothache. Earache. Headache. Bad breath. Decreased sense of smell and taste. A cough, which worsens when you are lying flat. Fatigue. Fever. Thick drainage from your nose, which often is green and may contain pus (purulent). Swelling and warmth over the affected sinuses. DIAGNOSIS  Your caregiver will perform a physical exam. During the exam, your caregiver may: Look in your nose for signs of abnormal growths in your nostrils (nasal polyps). Tap over the affected sinus to check for signs of infection. View the inside of your sinuses (endoscopy) with a special imaging device with a light attached (endoscope), which is inserted into your sinuses. If your caregiver suspects that you have chronic sinusitis, one or more of the following tests may be recommended: Allergy tests. Nasal culture A sample of mucus is taken from your nose and sent to a lab and screened for bacteria. Nasal cytology A sample of mucus is taken from your nose and examined by your caregiver to determine if your sinusitis is related to an allergy. TREATMENT  Most cases of acute sinusitis are related to a viral infection and will resolve on their own within 10 days. Sometimes medicines are prescribed to help relieve symptoms (pain medicine, decongestants, nasal steroid sprays, or saline sprays).  However, for sinusitis related to a bacterial infection, your caregiver will prescribe antibiotic medicines. These are medicines that will help kill the bacteria causing the infection.  Rarely, sinusitis is caused by a fungal infection. In theses cases, your caregiver will prescribe antifungal medicine. For some cases of chronic sinusitis, surgery is needed. Generally, these are cases  in which sinusitis recurs more than 3 times per year, despite other treatments. HOME CARE INSTRUCTIONS  Drink plenty of water. Water helps thin the mucus so your sinuses can drain more easily. Use a humidifier. Inhale steam 3 to 4 times a day (for example, sit in the bathroom with the shower running). Apply a warm, moist washcloth to your face 3 to 4 times a day, or as directed by your caregiver. Use saline nasal sprays to help moisten and clean your sinuses. Take over-the-counter or prescription medicines for pain, discomfort, or fever only as directed by your caregiver. SEEK IMMEDIATE MEDICAL CARE IF: You have increasing pain or severe headaches. You have nausea, vomiting, or drowsiness. You have swelling around your face. You have vision problems. You have a stiff neck. You have difficulty breathing. MAKE SURE YOU:  Understand these instructions. Will watch your condition. Will get help right away if you are not doing well or get worse. Document Released: 08/26/2005 Document Revised: 11/18/2011 Document Reviewed: 09/10/2011 [facility] Patient Information 2014 [place], [place].    If you have been instructed to have an in-person evaluation today at a local Urgent Care facility, please use the link below. It will take you to a list  of all of our available [facility], including address, phone number and hours of operation. Please do not delay care.  [facility]  If you or a family member do not have a primary care provider, use the link below to schedule a visit and establish care. When you choose a [facility] primary care physician or advanced practice provider, you gain a long-term partner in health. Find a Primary Care Provider  Learn more about [facility]'s in-office and virtual care options: [facility] - Get Care Now  "
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
## 2024-09-20 ENCOUNTER — Encounter

## 2024-09-23 ENCOUNTER — Other Ambulatory Visit: Payer: Self-pay

## 2024-09-23 ENCOUNTER — Ambulatory Visit

## 2024-09-23 VITALS — BP 120/80 | HR 72 | Temp 98.3°F | Ht 64.0 in | Wt 191.8 lb

## 2024-09-23 DIAGNOSIS — K219 Gastro-esophageal reflux disease without esophagitis: Secondary | ICD-10-CM

## 2024-09-23 DIAGNOSIS — I10 Essential (primary) hypertension: Secondary | ICD-10-CM

## 2024-09-23 DIAGNOSIS — G8929 Other chronic pain: Secondary | ICD-10-CM | POA: Insufficient documentation

## 2024-09-23 DIAGNOSIS — F39 Unspecified mood [affective] disorder: Secondary | ICD-10-CM

## 2024-09-23 DIAGNOSIS — E559 Vitamin D deficiency, unspecified: Secondary | ICD-10-CM

## 2024-09-23 DIAGNOSIS — Z23 Encounter for immunization: Secondary | ICD-10-CM

## 2024-09-23 DIAGNOSIS — E611 Iron deficiency: Secondary | ICD-10-CM | POA: Diagnosis not present

## 2024-09-23 DIAGNOSIS — Z1211 Encounter for screening for malignant neoplasm of colon: Secondary | ICD-10-CM

## 2024-09-23 DIAGNOSIS — J301 Allergic rhinitis due to pollen: Secondary | ICD-10-CM | POA: Diagnosis not present

## 2024-09-23 DIAGNOSIS — R7303 Prediabetes: Secondary | ICD-10-CM | POA: Diagnosis not present

## 2024-09-23 DIAGNOSIS — M545 Low back pain, unspecified: Secondary | ICD-10-CM | POA: Diagnosis not present

## 2024-09-23 DIAGNOSIS — F5101 Primary insomnia: Secondary | ICD-10-CM | POA: Diagnosis not present

## 2024-09-23 DIAGNOSIS — R058 Other specified cough: Secondary | ICD-10-CM | POA: Diagnosis not present

## 2024-09-23 DIAGNOSIS — E782 Mixed hyperlipidemia: Secondary | ICD-10-CM | POA: Diagnosis not present

## 2024-09-23 MED ORDER — SERTRALINE HCL 100 MG PO TABS
150.0000 mg | ORAL_TABLET | Freq: Every day | ORAL | 3 refills | Status: AC
  Filled 2024-09-23: qty 135, 90d supply, fill #0

## 2024-09-23 MED ORDER — IRON (FERROUS SULFATE) 325 (65 FE) MG PO TABS
325.0000 mg | ORAL_TABLET | Freq: Every day | ORAL | 3 refills | Status: AC
  Filled 2024-09-23: qty 90, 90d supply, fill #0

## 2024-09-23 MED ORDER — ROSUVASTATIN CALCIUM 10 MG PO TABS
10.0000 mg | ORAL_TABLET | Freq: Every evening | ORAL | 3 refills | Status: AC
  Filled 2024-09-23: qty 90, 90d supply, fill #0

## 2024-09-23 MED ORDER — OMEPRAZOLE 40 MG PO CPDR
40.0000 mg | DELAYED_RELEASE_CAPSULE | Freq: Every day | ORAL | 3 refills | Status: AC | PRN
  Filled 2024-09-23: qty 90, 90d supply, fill #0

## 2024-09-23 MED ORDER — ALBUTEROL SULFATE HFA 108 (90 BASE) MCG/ACT IN AERS
2.0000 | INHALATION_SPRAY | Freq: Four times a day (QID) | RESPIRATORY_TRACT | 2 refills | Status: AC | PRN
  Filled 2024-09-23: qty 6.7, 30d supply, fill #0

## 2024-09-23 MED ORDER — VITAMIN D (ERGOCALCIFEROL) 1.25 MG (50000 UNIT) PO CAPS
50000.0000 [IU] | ORAL_CAPSULE | ORAL | 3 refills | Status: AC
  Filled 2024-09-23: qty 12, 84d supply, fill #0

## 2024-09-23 MED ORDER — AMLODIPINE BESYLATE 2.5 MG PO TABS
2.5000 mg | ORAL_TABLET | Freq: Every day | ORAL | 3 refills | Status: AC
  Filled 2024-09-23: qty 90, 90d supply, fill #0

## 2024-09-23 MED ORDER — CETIRIZINE HCL 10 MG PO TABS
10.0000 mg | ORAL_TABLET | Freq: Every day | ORAL | 3 refills | Status: AC
  Filled 2024-09-23: qty 90, 90d supply, fill #0

## 2024-09-23 MED ORDER — TRAZODONE HCL 50 MG PO TABS
25.0000 mg | ORAL_TABLET | Freq: Every day | ORAL | 3 refills | Status: AC
  Filled 2024-09-23: qty 45, 90d supply, fill #0

## 2024-09-23 MED ORDER — FLUTICASONE PROPIONATE 50 MCG/ACT NA SUSP
2.0000 | Freq: Two times a day (BID) | NASAL | 3 refills | Status: DC
  Filled 2024-09-23: qty 16, fill #0

## 2024-09-23 MED ORDER — DICLOFENAC SODIUM 1 % EX GEL
2.0000 g | Freq: Four times a day (QID) | CUTANEOUS | 5 refills | Status: AC
  Filled 2024-09-23: qty 100, 12d supply, fill #0

## 2024-09-23 MED ORDER — FLUTICASONE PROPIONATE 50 MCG/ACT NA SUSP
2.0000 | Freq: Every day | NASAL | 3 refills | Status: AC
  Filled 2024-09-23: qty 16, 30d supply, fill #0

## 2024-09-23 NOTE — Patient Instructions (Addendum)
 [address]  [place] [address]  Near the intersection of: [address]  Your new prescriptions for all your medications has been sent to [facility] outpatient pharmacy address as above. If you have trouble with refills please reach out to our clinic. Phone number for the pharmacy is: [phone]  For sleep: Apply the following lifestyle modifications plus:  Trazodone  25 mg 30 min before you go to bed.  Please break the 50 mg tablet in half.  Recommendation Details  Regular bedtime and rise time Having a consistent bedtime and rise time leads to more regular sleep schedules and avoids periods of sleep deprivation or periods of extended wakefulness during the night.  Avoid napping Avoid napping, especially naps lasting longer than 1 hour and naps late in the day.  Limit caffeine Avoid caffeine after lunch. The time between lunch and bedtime represents approximately 2 half-lives for caffeine, and this time window allows for most caffeine to be metabolized before bedtime.  Limit alcohol Recommendations are typically focused on avoiding alcohol near bedtime. Alcohol is initially sedating, but activating as it is metabolized. Alcohol also negatively impacts sleep architecture.  Avoid nicotine Nicotine is a stimulant and should be avoided near bedtime and at night.  Exercise Daytime physical activity is encouraged, in particular, 4 to 6 hours before bedtime, as this may facilitate sleep onset. Rigorous exercise within 2 hours of bedtime is discouraged.  Keep the sleep environment quiet and dark Noise and light exposure during the night can disrupt sleep. White noise or ear plugs are often recommended to reduce disturbing noise. Using blackout shades or an eye mask is commonly recommended to reduce light. This may also include avoiding exposure to television or technology near bedtime, as this can have an impact on circadian rhythms by shifting sleep timing later.  Bedroom clock Avoid  checking the time at night. This includes alarm clocks and other time pieces (eg, watches and smart phones). Checking the time increases cognitive arousal and prolongs wakefulness.  Evening eating Avoid a large meal close to bedtime. Eat a healthy and filling (but not too heavy) meal in the early evening and avoid late-night snacks.    For mammogram:  YOUR MAMMOGRAM IS DUE, PLEASE CALL AND GET THIS SCHEDULED! [facility] - call [phone]   --  You are also due for pneumonia, shingles vaccine. Please consider updating these thorough your local pharmacy.

## 2024-09-23 NOTE — Assessment & Plan Note (Addendum)
 Start omeprazole  40 mg as needed daily.  Prescription sent into pharmacy. Orders:   omeprazole  (PRILOSEC) 40 MG capsule; Take 1 capsule (40 mg total) by mouth daily as needed. On empty stomach 30 min before food

## 2024-09-23 NOTE — Progress Notes (Signed)
 "  Established Patient Office Visit   Subjective  Patient ID: Braniya Farrugia, female    DOB: [birth year]  Age: 54 y.o. MRN: [id number]  Chief Complaint  Patient presents with   Hyperlipidemia   Gastroesophageal Reflux   Insomnia   Knee Pain   Hypertension   Mood     Discussed the use of AI scribe software for clinical note transcription with the patient, who gave verbal consent to proceed.  History of Present Illness Cheryn Lundquist is a 54 year old female with hypertension, b/l knee pain, back pain, prediabetes, hyperlipidemia and insomnia who presents with medication management issues and persistent throat pain.  She is experiencing issues with her pharmacy, [facility], which is charging [phone] for her 90-day supply of medications. 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 She has tried melatonin and Unisom without success. She has a high tolerance for medications, making it difficult for them to work effectively.   She has been experiencing persistent throat pain since December, with symptoms of sore throat and nasal discharge. She has not fully recovered since her last visit and has been sick off and on. She has not yet received her flu shot.  She was treated with prednisone , Augmentin , Tessalon  in December for her symptoms.  She reports knee pain that has been worsening over the past month, with symptoms of the knee locking up and requiring her to take baby steps. This has been a long-standing issue but has become more severe recently.  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    ROS As per HPI    Objective:      BP 120/80 (Cuff Size: Normal)   Pulse 72   Temp 98.3 F (36.8 C) (Oral)   Ht 5' 4 (1.626 m)   Wt 191 lb 12.8 oz (87 kg)   LMP 03/08/2018   SpO2 98%   BMI 32.92 kg/m      09/23/2024    8:39 AM 05/24/2024   10:14 AM 01/08/2024    1:40 PM  Depression screen PHQ 2/9  Decreased Interest 1 1 2   Down, Depressed, Hopeless 1 1 1   PHQ - 2 Score 2 2 3   Altered sleeping 3 3 3   Tired, decreased energy 2 3 3   Change in appetite 0 0 0  Feeling bad or failure about yourself  0 0 0  Trouble concentrating 0 0 0  Moving slowly or fidgety/restless 0 0 0  Suicidal thoughts 0 0 0  PHQ-9 Score 7 8  9    Difficult doing work/chores Somewhat difficult Somewhat difficult Somewhat difficult     Data saved with a previous flowsheet row definition      09/23/2024    8:41 AM 05/24/2024   10:15 AM 01/08/2024    1:41 PM 09/22/2023    9:01 AM  GAD 7 : Generalized Anxiety Score  Nervous, Anxious, on Edge 0 0 0 0  Control/stop worrying 0 1 1 0  Worry too much - different things 0 1 0 0  Trouble relaxing 0 2 2 1   Restless 0 0 0 0  Easily annoyed or irritable 0 0 0  0  Afraid - awful might happen 0 0 0 0  Total GAD 7 Score 0 4 3 1   Anxiety Difficulty Not difficult at all Somewhat difficult Somewhat difficult Not difficult at all      09/23/2024    8:39 AM 05/24/2024   10:14 AM 01/08/2024    1:40 PM  Depression screen PHQ 2/9  Decreased Interest 1 1 2   Down, Depressed, Hopeless 1 1 1   PHQ - 2 Score 2 2 3   Altered sleeping 3 3 3   Tired, decreased energy 2 3 3   Change in appetite 0 0 0  Feeling bad or failure about yourself  0 0 0  Trouble concentrating 0 0 0  Moving slowly or fidgety/restless 0 0 0  Suicidal thoughts 0 0 0  PHQ-9 Score 7 8  9    Difficult doing work/chores Somewhat difficult Somewhat difficult Somewhat difficult     Data saved with a previous flowsheet row definition      09/23/2024    8:41 AM 05/24/2024   10:15 AM 01/08/2024    1:41 PM 09/22/2023    9:01 AM  GAD 7 :  Generalized Anxiety Score  Nervous, Anxious, on Edge 0 0 0 0  Control/stop worrying 0 1 1 0  Worry too much - different things 0 1 0 0  Trouble relaxing 0 2 2 1   Restless 0 0 0 0  Easily annoyed or irritable 0 0 0 0  Afraid - awful might happen 0 0 0 0  Total GAD 7 Score 0 4 3 1   Anxiety Difficulty Not difficult at all Somewhat difficult Somewhat difficult Not difficult at all   SDOH Screenings   Depression (PHQ2-9): Medium Risk (09/23/2024)  Social Connections: Unknown (01/22/2022)   Received from [facility]  Tobacco Use: Medium Risk (09/23/2024)     Physical Exam Constitutional:      Appearance: Normal appearance.  HENT:     Head: Normocephalic and atraumatic.     Mouth/Throat:     Mouth: Mucous membranes are moist.  Neck:     Thyroid : No thyroid  mass or thyroid  tenderness.  Cardiovascular:     Rate and Rhythm: Normal rate and regular rhythm.  Pulmonary:     Effort: Pulmonary effort is normal.     Breath sounds: Normal breath sounds.  Abdominal:     General: Bowel sounds are normal.     Palpations: Abdomen is soft.  Musculoskeletal:     Cervical back: Neck supple. No rigidity.     Right knee: Crepitus present. Decreased range of motion. Tenderness present.     Left knee: Crepitus present. Decreased range of motion. Tenderness present.     Right lower leg: No edema.     Left lower leg: No edema.     Comments: B/l knees: Pain on palpation over bilateral patella, pain elicited on active range of motion bilaterally.  Skin:    General: Skin is warm.  Neurological:     Mental Status: She is alert and oriented to person, place, and time.  Psychiatric:        Mood and Affect: Mood normal.        Behavior: Behavior normal.        No results found for any visits on 09/23/24.  The 10-year ASCVD risk score (Arnett DK, et al., 2019) is: 2.7%     Assessment & Plan:  Patient is a pleasant 54 year old female presenting for chronic disease management.  Due to cost  related with  previous [facility] pharmacy shared decision making was done to change her pharmacy to [facility] outpatient pharmacy.  All prescriptions sent to the new pharmacy during today's visit. Assessment & Plan Mood disorder No SI, HI.  Continue sertraline  150 mg daily. Orders:   sertraline  (ZOLOFT ) 100 MG tablet; Take 1.5 tablets (150 mg total) by mouth daily.  Mixed hyperlipidemia Start Crestor  10 mg daily.  Prescription sent to the pharmacy.  She was not able to start this medication which was prescribed to her after her last lab due to cost.  Prescription sent to [facility].  Follow-up in 6 months with repeat lipid panel, CMP.  Orders:   rosuvastatin  (CRESTOR ) 10 MG tablet; Take 1 tablet (10 mg total) by mouth every evening.   Comp Met (CMET); Future   Lipid panel; Future  Gastroesophageal reflux disease without esophagitis Start omeprazole  40 mg as needed daily.  Prescription sent into pharmacy. Orders:   omeprazole  (PRILOSEC) 40 MG capsule; Take 1 capsule (40 mg total) by mouth daily as needed. On empty stomach 30 min before food  Primary insomnia Shift worker insomnia.  Start trazodone  25 mg daily.  Discussed potential risk of serotonin syndrome given she is on sertraline  as well.  Recommend patient reach out to us  if she develops any agitation, fever, sweating, palpitations, tremors. Orders:   traZODone  (DESYREL ) 50 MG tablet; Take 0.5 tablets (25 mg total) by mouth at bedtime.  Iron  deficiency History of iron  deficiency anemia with previously taking ferrous sulfate .  Prescription sent to [place] [facility]. Orders:   Iron , Ferrous Sulfate , 325 (65 Fe) MG TABS; Take 325 mg by mouth daily.  Seasonal allergic rhinitis due to pollen Resume nasal Flonase  2 sprays in each nostril daily.  Start Zyrtec  10 mg daily. Orders:   cetirizine  (ZYRTEC ) 10 MG tablet; Take 1 tablet (10 mg total) by mouth at bedtime. Prn   fluticasone  (FLONASE ) 50 MCG/ACT nasal spray;  Place 2 sprays into both nostrils daily.  Chronic bilateral low back pain without sciatica Patient has not started diclofenac  1% gel due to cost.  Prescription sent to [place] [facility].  Trial of Voltaren  gel up to 4 times a day Orders:   diclofenac  Sodium (VOLTAREN ) 1 % GEL; Apply 2 g topically 4 (four) times daily.  Essential hypertension Blood pressure on arrival was elevated.  Repeat blood pressure was within goal of less than 130 x 80 mmHg.  Continue amlodipine  2.5 mg daily.  Refill sent to [place] [facility]. Orders:   amLODipine  (NORVASC ) 2.5 MG tablet; Take 1 tablet (2.5 mg total) by mouth daily.  Other cough Was treated for URI in December with prednisone , Augmentin , Tessalon  with lingering cough, postnasal drip.  Recommend using albuterol  inhaler as needed for cough, wheezing and starting daily nasal Flonase  2 sprays in each nostril and Zyrtec  10 mg daily.  Flu immunization was Orders:   albuterol  (VENTOLIN  HFA) 108 (90 Base) MCG/ACT inhaler; Inhale 2 puffs into the lungs every 6 (six) hours as needed for wheezing or shortness of breath.  Vitamin D  deficiency Patient has not started taking vitamin D  prescription strength that was prescribed to her during her last visit.  New prescription for vitamin D  50,000 units once a week sent to [place] [facility].  Plan on repeating vitamin D  level during her 56-month follow-up. Orders:   Vitamin D , Ergocalciferol , (DRISDOL ) 1.25 MG (50000 UNIT) CAPS capsule; Take 1 capsule (50,000 Units total) by mouth every 7 (seven) days.   Vitamin D  (25 hydroxy); Future  Screening  for colon cancer Due for colorectal cancer screening. Orders:   Ambulatory referral to Gastroenterology  Chronic pain of both knees Chronic pain of bilateral knee with worsening symptoms, right worse than left with locking sensation for the last 1 month.  Referral to orthopedic department for further evaluation and management made. Orders:   Ambulatory referral to Orthopedic  Surgery  Needs flu shot Updated today. Orders:   Flu vaccine trivalent PF, 6mos and older(Flulaval,Afluria,Fluarix,Fluzone)  Prediabetes Known history of prediabetes.  Will repeat A1c during her next set of labs.  Future order placed.  Orders:   HgB A1c; Future     Return in about 6 months (around 03/23/2025) for Chronic follow up .   Luke Shade, MD "

## 2024-09-23 NOTE — Assessment & Plan Note (Addendum)
 Blood pressure on arrival was elevated.  Repeat blood pressure was within goal of less than 130 x 80 mmHg.  Continue amlodipine  2.5 mg daily.  Refill sent to [place] [facility]. Orders:   amLODipine  (NORVASC ) 2.5 MG tablet; Take 1 tablet (2.5 mg total) by mouth daily.

## 2024-09-23 NOTE — Assessment & Plan Note (Addendum)
 Chronic pain of bilateral knee with worsening symptoms, right worse than left with locking sensation for the last 1 month.  Referral to orthopedic department for further evaluation and management made. Orders:   Ambulatory referral to Orthopedic Surgery

## 2024-09-23 NOTE — Assessment & Plan Note (Signed)
 Updated today.  Orders:   Flu vaccine trivalent PF, 6mos and older(Flulaval,Afluria,Fluarix,Fluzone)

## 2024-09-23 NOTE — Assessment & Plan Note (Addendum)
 Patient has not started taking vitamin D  prescription strength that was prescribed to her during her last visit.  New prescription for vitamin D  50,000 units once a week sent to [place] [facility].  Plan on repeating vitamin D  level during her 42-month follow-up. Orders:   Vitamin D , Ergocalciferol , (DRISDOL ) 1.25 MG (50000 UNIT) CAPS capsule; Take 1 capsule (50,000 Units total) by mouth every 7 (seven) days.   Vitamin D  (25 hydroxy); Future

## 2024-09-23 NOTE — Assessment & Plan Note (Addendum)
 Start Crestor  10 mg daily.  Prescription sent to the pharmacy.  She was not able to start this medication which was prescribed to her after her last lab due to cost.  Prescription sent to [facility] pharmacy.  Follow-up in 6 months with repeat lipid panel, CMP.  Orders:   rosuvastatin  (CRESTOR ) 10 MG tablet; Take 1 tablet (10 mg total) by mouth every evening.   Comp Met (CMET); Future   Lipid panel; Future

## 2024-09-23 NOTE — Assessment & Plan Note (Addendum)
 Resume nasal Flonase  2 sprays in each nostril daily.  Start Zyrtec  10 mg daily. Orders:   cetirizine  (ZYRTEC ) 10 MG tablet; Take 1 tablet (10 mg total) by mouth at bedtime. Prn   fluticasone  (FLONASE ) 50 MCG/ACT nasal spray; Place 2 sprays into both nostrils daily.

## 2024-09-23 NOTE — Assessment & Plan Note (Addendum)
 History of iron  deficiency anemia with previously taking ferrous sulfate .  Prescription sent to [place] [facility]. Orders:   Iron , Ferrous Sulfate , 325 (65 Fe) MG TABS; Take 325 mg by mouth daily.

## 2024-09-23 NOTE — Assessment & Plan Note (Addendum)
 Patient has not started diclofenac  1% gel due to cost.  Prescription sent to [place] [facility].  Trial of Voltaren  gel up to 4 times a day Orders:   diclofenac  Sodium (VOLTAREN ) 1 % GEL; Apply 2 g topically 4 (four) times daily.

## 2024-09-23 NOTE — Assessment & Plan Note (Addendum)
 No SI, HI.  Continue sertraline  150 mg daily. Orders:   sertraline  (ZOLOFT ) 100 MG tablet; Take 1.5 tablets (150 mg total) by mouth daily.

## 2024-09-23 NOTE — Assessment & Plan Note (Addendum)
 Due for colorectal cancer screening. Orders:   Ambulatory referral to Gastroenterology

## 2024-09-23 NOTE — Assessment & Plan Note (Addendum)
 Shift worker insomnia.  Start trazodone  25 mg daily.  Discussed potential risk of serotonin syndrome given she is on sertraline  as well.  Recommend patient reach out to us  if she develops any agitation, fever, sweating, palpitations, tremors. Orders:   traZODone  (DESYREL ) 50 MG tablet; Take 0.5 tablets (25 mg total) by mouth at bedtime.

## 2024-09-23 NOTE — Assessment & Plan Note (Addendum)
 Was treated for URI in December with prednisone , Augmentin , Tessalon  with lingering cough, postnasal drip.  Recommend using albuterol  inhaler as needed for cough, wheezing and starting daily nasal Flonase  2 sprays in each nostril and Zyrtec  10 mg daily.  Flu immunization was Orders:   albuterol  (VENTOLIN  HFA) 108 (90 Base) MCG/ACT inhaler; Inhale 2 puffs into the lungs every 6 (six) hours as needed for wheezing or shortness of breath.

## 2024-10-04 ENCOUNTER — Other Ambulatory Visit: Payer: Self-pay

## 2025-03-23 ENCOUNTER — Ambulatory Visit
